# Patient Record
Sex: Female | Born: 1945
Health system: Southern US, Community
[De-identification: ages and names within clinical notes are randomized; demographics above are authoritative.]

## PROBLEM LIST (undated history)

## (undated) DIAGNOSIS — F32A Depression, unspecified: Secondary | ICD-10-CM

## (undated) DIAGNOSIS — R251 Tremor, unspecified: Secondary | ICD-10-CM

## (undated) DIAGNOSIS — K589 Irritable bowel syndrome without diarrhea: Secondary | ICD-10-CM

## (undated) DIAGNOSIS — K5792 Diverticulitis of intestine, part unspecified, without perforation or abscess without bleeding: Secondary | ICD-10-CM

## (undated) DIAGNOSIS — E559 Vitamin D deficiency, unspecified: Secondary | ICD-10-CM

## (undated) DIAGNOSIS — G43909 Migraine, unspecified, not intractable, without status migrainosus: Secondary | ICD-10-CM

## (undated) DIAGNOSIS — F329 Major depressive disorder, single episode, unspecified: Secondary | ICD-10-CM

## (undated) HISTORY — PX: OTHER SURGICAL HISTORY: SHX169

## (undated) HISTORY — DX: Tremor, unspecified: R25.1

## (undated) HISTORY — DX: Irritable bowel syndrome, unspecified: K58.9

## (undated) HISTORY — DX: Vitamin D deficiency, unspecified: E55.9

## (undated) HISTORY — DX: Major depressive disorder, single episode, unspecified: F32.9

## (undated) HISTORY — PX: ABDOMINAL HYSTERECTOMY: SHX81

## (undated) HISTORY — DX: Migraine, unspecified, not intractable, without status migrainosus: G43.909

## (undated) HISTORY — DX: Diverticulitis of intestine, part unspecified, without perforation or abscess without bleeding: K57.92

## (undated) HISTORY — DX: Depression, unspecified: F32.A

---

## 2008-03-18 ENCOUNTER — Encounter: Admission: RE | Admit: 2008-03-18 | Discharge: 2008-03-18 | Payer: Self-pay | Admitting: Family Medicine

## 2009-04-22 IMAGING — MG MM SCREEN MAMMOGRAM BILATERAL
4 series · 4 of 4 positions shown · non-contrast
Comparison: none

DG SCREEN MAMMOGRAM BILATERAL
Bilateral CC and MLO view(s) were taken.

DIGITAL SCREENING MAMMOGRAM WITH CAD:
There are scattered fibroglandular densities.  There is no dominant mass, architectural distortion 
or calcification to suggest malignancy.

[R CC]
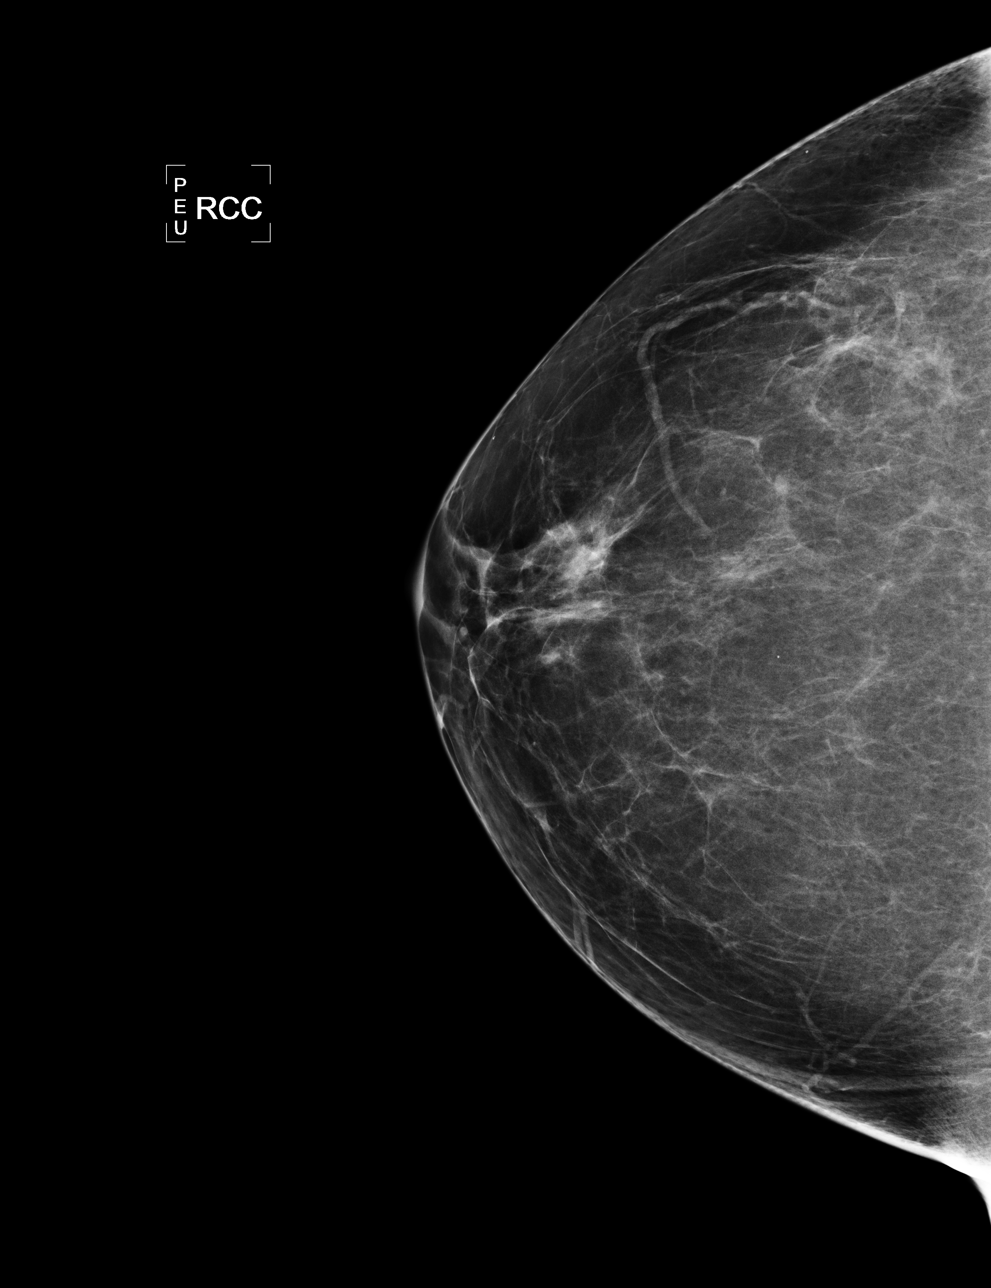

[L CC]
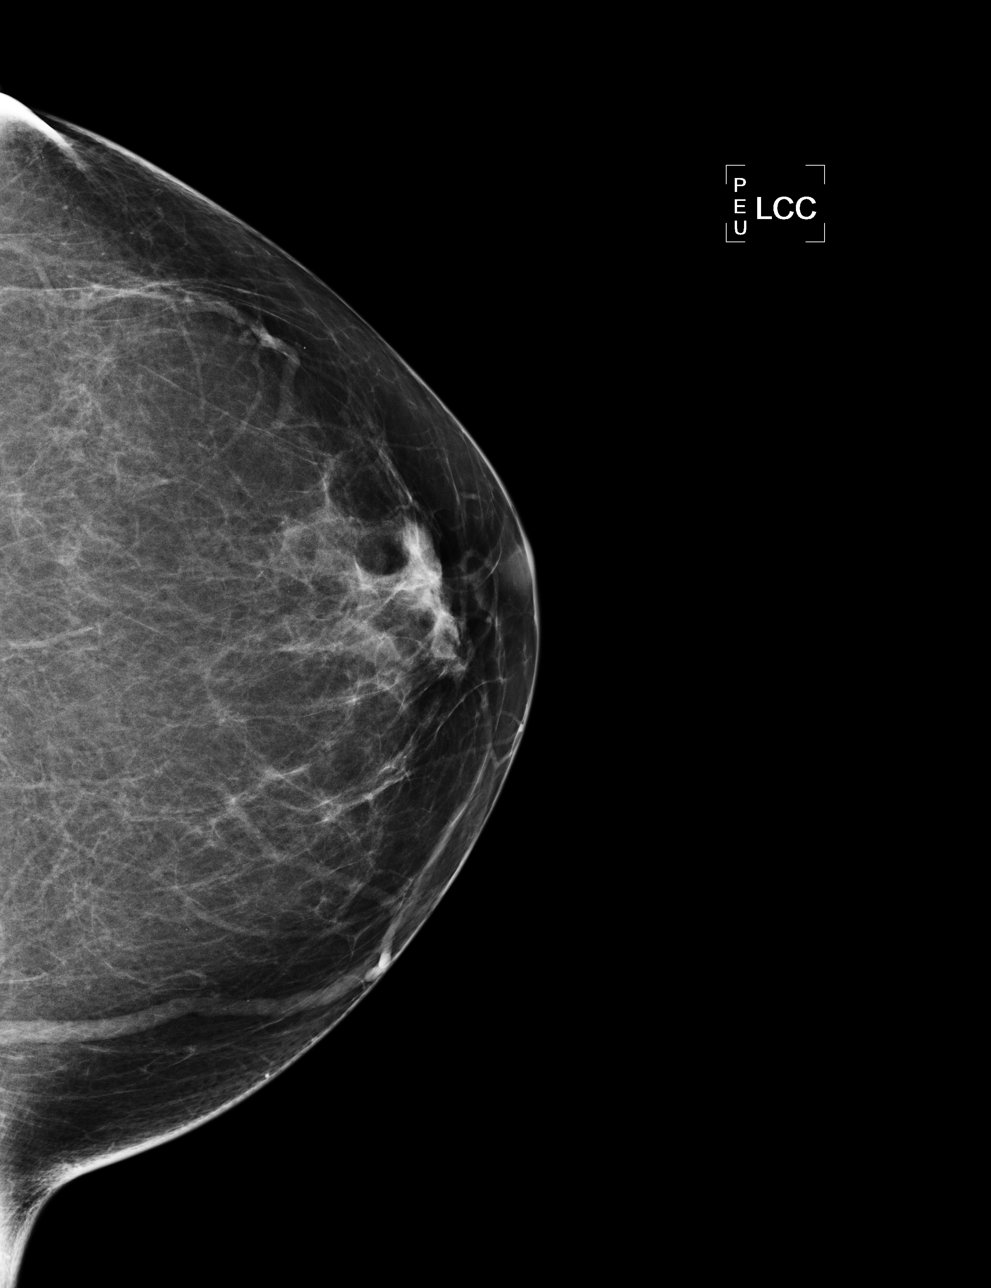

[L MLO]
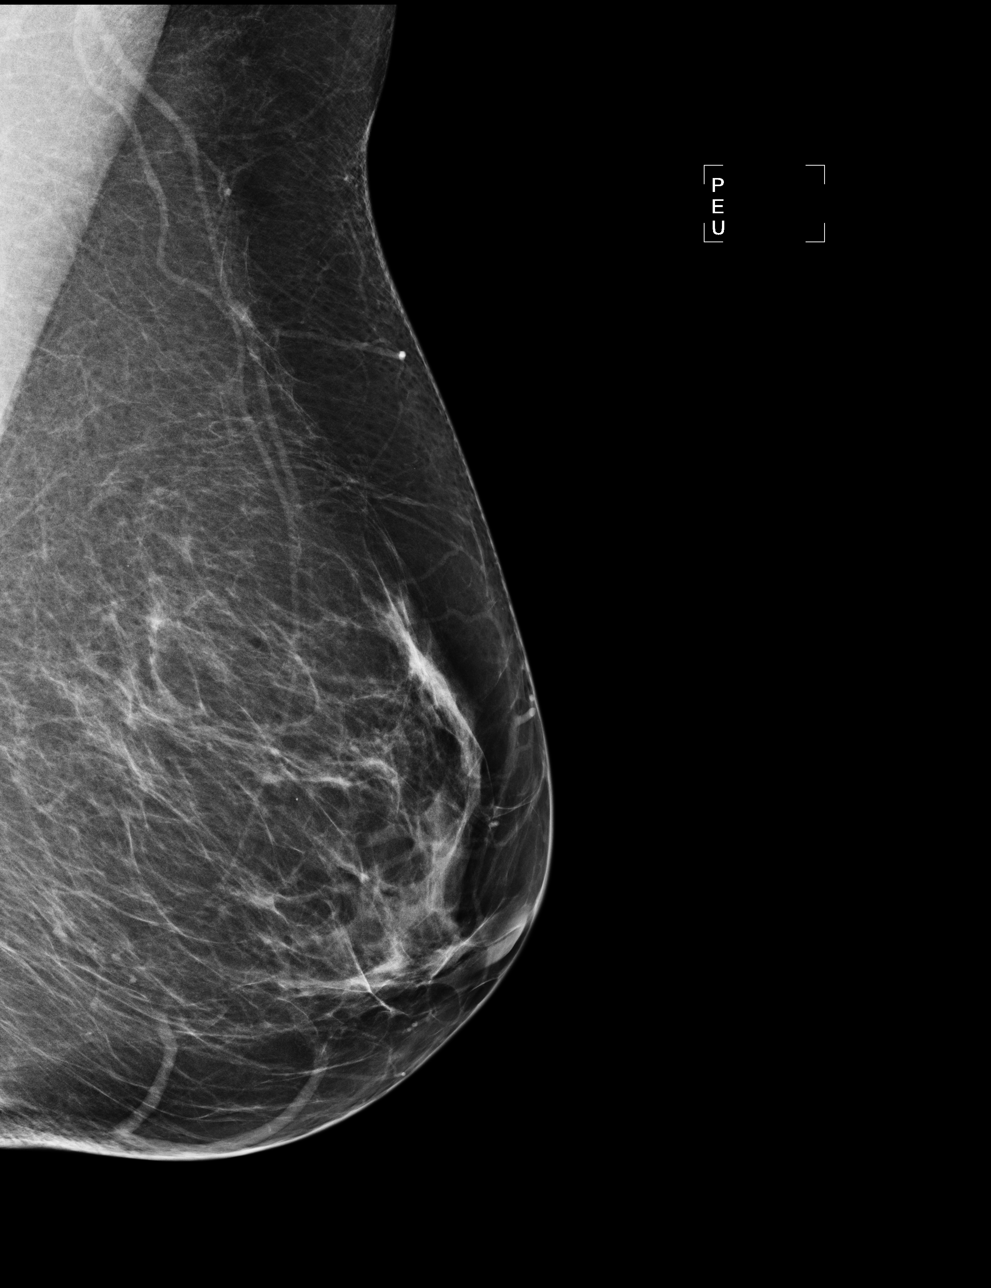

[R MLO]
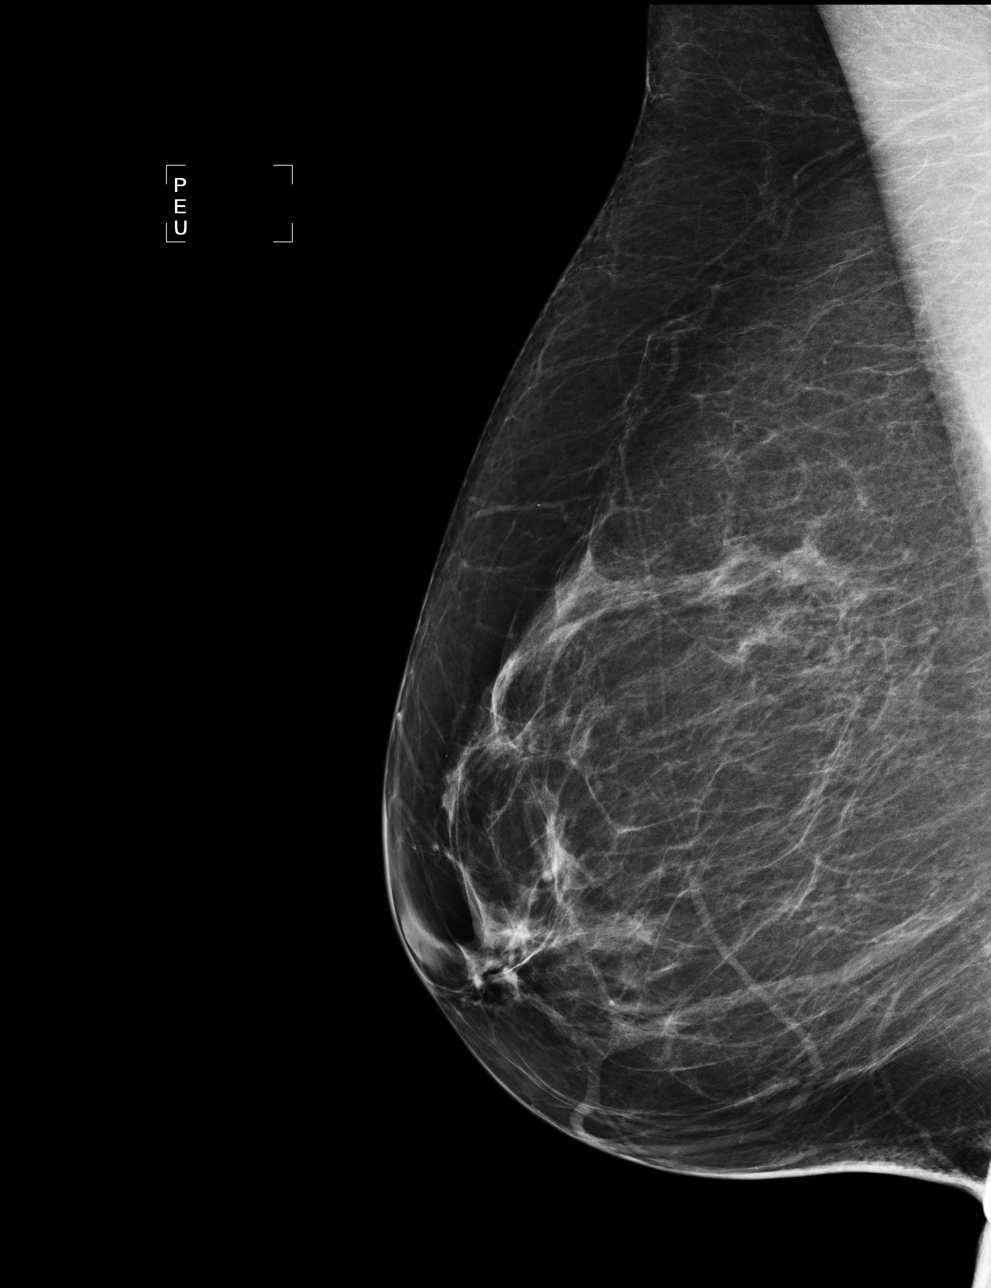

[4 of 4 positions shown; findings below may reference images not displayed]

IMPRESSION: No mammographic evidence of malignancy.  Suggest yearly screening mammography.

ASSESSMENT: Negative - BI-RADS 1

Screening mammogram in 1 year.
ANALYZED BY COMPUTER AIDED DETECTION. , THIS PROCEDURE WAS A DIGITAL MAMMOGRAM.

## 2010-01-15 ENCOUNTER — Encounter: Admission: RE | Admit: 2010-01-15 | Discharge: 2010-01-15 | Payer: Self-pay | Admitting: Family Medicine

## 2010-01-21 ENCOUNTER — Encounter: Admission: RE | Admit: 2010-01-21 | Discharge: 2010-01-21 | Payer: Self-pay | Admitting: Family Medicine

## 2010-09-02 ENCOUNTER — Encounter: Admission: RE | Admit: 2010-09-02 | Discharge: 2010-09-02 | Payer: Self-pay | Admitting: Family Medicine

## 2011-03-10 ENCOUNTER — Other Ambulatory Visit: Payer: Self-pay | Admitting: Family Medicine

## 2011-03-10 DIAGNOSIS — Z1231 Encounter for screening mammogram for malignant neoplasm of breast: Secondary | ICD-10-CM

## 2011-03-16 ENCOUNTER — Ambulatory Visit
Admission: RE | Admit: 2011-03-16 | Discharge: 2011-03-16 | Disposition: A | Discharge status: Home / Self Care | Payer: BC Managed Care – PPO | Source: Ambulatory Visit | Attending: Family Medicine | Admitting: Family Medicine

## 2011-03-16 DIAGNOSIS — Z1231 Encounter for screening mammogram for malignant neoplasm of breast: Secondary | ICD-10-CM

## 2012-02-16 ENCOUNTER — Other Ambulatory Visit: Payer: Self-pay | Admitting: Family Medicine

## 2012-02-16 DIAGNOSIS — Z1231 Encounter for screening mammogram for malignant neoplasm of breast: Secondary | ICD-10-CM

## 2012-04-18 ENCOUNTER — Ambulatory Visit
Admission: RE | Admit: 2012-04-18 | Discharge: 2012-04-18 | Disposition: A | Discharge status: Home / Self Care | Payer: BC Managed Care – PPO | Source: Ambulatory Visit | Attending: Family Medicine | Admitting: Family Medicine

## 2012-04-18 DIAGNOSIS — Z1231 Encounter for screening mammogram for malignant neoplasm of breast: Secondary | ICD-10-CM

## 2013-03-28 ENCOUNTER — Other Ambulatory Visit: Payer: Self-pay

## 2013-03-28 DIAGNOSIS — Z1231 Encounter for screening mammogram for malignant neoplasm of breast: Secondary | ICD-10-CM

## 2013-04-19 ENCOUNTER — Ambulatory Visit
Admission: RE | Admit: 2013-04-19 | Discharge: 2013-04-19 | Disposition: A | Discharge status: Home / Self Care | Payer: BC Managed Care – PPO | Source: Ambulatory Visit

## 2013-04-19 DIAGNOSIS — Z1231 Encounter for screening mammogram for malignant neoplasm of breast: Secondary | ICD-10-CM

## 2014-03-15 ENCOUNTER — Other Ambulatory Visit: Payer: Self-pay

## 2014-03-15 DIAGNOSIS — Z1231 Encounter for screening mammogram for malignant neoplasm of breast: Secondary | ICD-10-CM

## 2014-05-07 ENCOUNTER — Ambulatory Visit
Admission: RE | Admit: 2014-05-07 | Discharge: 2014-05-07 | Disposition: A | Discharge status: Home / Self Care | Payer: BC Managed Care – PPO | Source: Ambulatory Visit

## 2014-05-07 ENCOUNTER — Encounter (INDEPENDENT_AMBULATORY_CARE_PROVIDER_SITE_OTHER): Payer: Self-pay

## 2014-05-07 DIAGNOSIS — Z1231 Encounter for screening mammogram for malignant neoplasm of breast: Secondary | ICD-10-CM

## 2014-06-24 DIAGNOSIS — G43009 Migraine without aura, not intractable, without status migrainosus: Secondary | ICD-10-CM | POA: Diagnosis not present

## 2014-09-19 DIAGNOSIS — Z85828 Personal history of other malignant neoplasm of skin: Secondary | ICD-10-CM | POA: Diagnosis not present

## 2015-01-23 DIAGNOSIS — E780 Pure hypercholesterolemia: Secondary | ICD-10-CM | POA: Diagnosis not present

## 2015-01-23 DIAGNOSIS — H6120 Impacted cerumen, unspecified ear: Secondary | ICD-10-CM | POA: Diagnosis not present

## 2015-01-23 DIAGNOSIS — Z131 Encounter for screening for diabetes mellitus: Secondary | ICD-10-CM | POA: Diagnosis not present

## 2015-01-23 DIAGNOSIS — F329 Major depressive disorder, single episode, unspecified: Secondary | ICD-10-CM | POA: Diagnosis not present

## 2015-01-23 DIAGNOSIS — E559 Vitamin D deficiency, unspecified: Secondary | ICD-10-CM | POA: Diagnosis not present

## 2015-01-23 DIAGNOSIS — Z0000 Encounter for general adult medical examination without abnormal findings: Secondary | ICD-10-CM | POA: Diagnosis not present

## 2015-01-23 DIAGNOSIS — M8580 Other specified disorders of bone density and structure, unspecified site: Secondary | ICD-10-CM | POA: Diagnosis not present

## 2015-05-08 DIAGNOSIS — E559 Vitamin D deficiency, unspecified: Secondary | ICD-10-CM | POA: Diagnosis not present

## 2015-07-01 ENCOUNTER — Other Ambulatory Visit: Payer: Self-pay

## 2015-07-01 DIAGNOSIS — Z1231 Encounter for screening mammogram for malignant neoplasm of breast: Secondary | ICD-10-CM

## 2015-07-29 ENCOUNTER — Ambulatory Visit
Admission: RE | Admit: 2015-07-29 | Discharge: 2015-07-29 | Disposition: A | Discharge status: Home / Self Care | Payer: BC Managed Care – PPO | Source: Ambulatory Visit

## 2015-07-29 DIAGNOSIS — Z1231 Encounter for screening mammogram for malignant neoplasm of breast: Secondary | ICD-10-CM | POA: Diagnosis not present

## 2015-08-04 DIAGNOSIS — M859 Disorder of bone density and structure, unspecified: Secondary | ICD-10-CM | POA: Diagnosis not present

## 2015-08-04 DIAGNOSIS — M8589 Other specified disorders of bone density and structure, multiple sites: Secondary | ICD-10-CM | POA: Diagnosis not present

## 2016-08-31 ENCOUNTER — Other Ambulatory Visit: Payer: Self-pay | Admitting: Family Medicine

## 2016-08-31 DIAGNOSIS — Z1231 Encounter for screening mammogram for malignant neoplasm of breast: Secondary | ICD-10-CM

## 2016-09-14 ENCOUNTER — Ambulatory Visit
Admission: RE | Admit: 2016-09-14 | Discharge: 2016-09-14 | Disposition: A | Discharge status: Home / Self Care | Payer: Medicare Other | Source: Ambulatory Visit | Attending: Family Medicine | Admitting: Family Medicine

## 2016-09-14 DIAGNOSIS — Z1231 Encounter for screening mammogram for malignant neoplasm of breast: Secondary | ICD-10-CM

## 2016-12-21 ENCOUNTER — Encounter: Payer: Self-pay | Admitting: Neurology

## 2016-12-21 ENCOUNTER — Ambulatory Visit (INDEPENDENT_AMBULATORY_CARE_PROVIDER_SITE_OTHER): Payer: Medicare Other | Admitting: Neurology

## 2016-12-21 VITALS — BP 130/78 | HR 68 | Resp 20 | Ht 63.0 in | Wt 145.0 lb

## 2016-12-21 DIAGNOSIS — G250 Essential tremor: Secondary | ICD-10-CM

## 2016-12-21 NOTE — Patient Instructions (Addendum)
Your history and exam are in keeping with essential tremor, no evidence of parkinsonism, which is reassuring.  You can continue with low dose propranolol. We will keep an eye on your symptoms and exam, recheck in 6 months and will consider using mysoline (primidone) in the future.

## 2016-12-21 NOTE — Progress Notes (Signed)
Subjective:    Patient ID: Karen Hubbard is a 71 y.o. female.  HPI     Star Age, MD, PhD [facility] [address] [place], [place] [address]  Dear Dr. Inda Merlin,   I saw your patient, Karen Hubbard, upon your kind request in my neurologic clinic today for initial consultation of her tremors. The patient is unaccompanied today. As you know, Ms. Seery is a 71 year old right-handed woman with an underlying medical history of depression, diverticulitis, irritable bowel syndrome, vitamin D deficiency, migraine headaches, and overweight state, who has had a tremor affecting her hands, head and voice for the past years, probably years. Her symptoms are progressive. She is worried about Parkinson's disease since her father had PD. She was started on propranolol in 2017 with improvement in her hand tremor reported. She is only taking a small dose d/t Hx of low BP typically, often in the 90s/50s. Her father lived to be 51 years old and died of lung cancer, mom had a rare blood disorder, cryoglobulinemia and died at 35. Her husband was recently diagnosed with Parkinson's disease and this has added stress. She has noticed that stress and tension in the house tends to make her tremor worse. She has 1 younger brother who has no tremor. She first noticed a hand tremor about 4 years ago, particularly at work when she was typing. She retired about a year ago from [employer]. She has 2 daughters, the older one age 13 has a hand tremor. About a year ago she noticed a voice tremor and more recently perhaps in the past 6 months she has noticed a head tremor, this was pointed out to her by her husband. She tries to stay active, she exercises regularly. She goes to cardio workouts 3 times a week. She is trying to work on her balance. She has not noticed any symptoms with one-sided weakness, numbness, tingling, memory loss. She has some mild balance issues. She is a  nonsmoker, drinks alcohol a couple times a week and typically no caffeine on a daily basis. Of note, she also noted improvement in her migraines when she started the propranolol.  I reviewed your office note from 11/25/2016, which you kindly included.  Her Past Medical History Is Significant For: Past Medical History:  Diagnosis Date  . Depression   . Diverticulitis   . IBS (irritable bowel syndrome)   . Migraine   . Tremor   . Vitamin D deficiency     Her Past Surgical History Is Significant For: Past Surgical History:  Procedure Laterality Date  . ABDOMINAL HYSTERECTOMY    . colectomy      Her Family History Is Significant For: Family History  Problem Relation Age of Onset  . Lung cancer Father   . Parkinson's disease Father     Her Social History Is Significant For: Social History   Social History  . Marital status: Married    Spouse name: N/A  . Number of children: N/A  . Years of education: N/A   Social History Main Topics  . Smoking status: Never Smoker  . Smokeless tobacco: Never Used  . Alcohol use No  . Drug use: No  . Sexual activity: Not on file   Other Topics Concern  . Not on file   Social History Narrative  . No narrative on file    Her Allergies Are:  No Known Allergies:   Her Current Medications Are:  Outpatient Encounter  Prescriptions as of 12/21/2016  Medication Sig  . cholecalciferol (VITAMIN D) 1000 units tablet Take 1,000 Units by mouth daily.  Marland Kitchen escitalopram (LEXAPRO) 10 MG tablet Take 20 mg by mouth daily.   . propranolol (INDERAL) 20 MG tablet Take 20 mg by mouth 3 (three) times daily.  . SUMAtriptan (IMITREX) 100 MG tablet Take 100 mg by mouth every 2 (two) hours as needed for migraine. May repeat in 2 hours if headache persists or recurs.   No facility-administered encounter medications on file as of 12/21/2016.    Review of Systems:  Out of a complete 14 point review of systems, all are reviewed and negative with the exception  of these symptoms as listed below:  Review of Systems  Gastrointestinal: Positive for constipation and diarrhea.  Neurological:       Pt presents today to discuss tremors. Pt reports that she has noticed tremors in her hands, throat, and head. Pt is taking propranolol and feels that it is helping.    Objective:  Neurologic Exam  Physical Exam Physical Examination:   Vitals:   12/21/16 1443  BP: 130/78  Pulse: 68  Resp: 20    General Examination: The patient is a very pleasant 71 y.o. female in no acute distress. She appears well-developed and well-nourished and well groomed. Very mildly anxious appearing.  HEENT: Normocephalic, atraumatic, pupils are equal, round and reactive to light and accommodation.Mild bilateral cataracts. Funduscopic exam is normal with sharp disc margins noted. Extraocular tracking is good without limitation to gaze excursion or nystagmus noted. Normal smooth pursuit is noted. Face is symmetric with normal facial animation and normal facial sensation. Speech is clear with mild intermittent voice tremor. no dysarthria noted. There is no hypophonia. There is no lip tremor or lower jaw tremor but she has mild intermittent head side to side trembling. Neck is supple with full range of passive and active motion. There are no carotid bruits on auscultation. Oropharynx exam reveals: mild mouth dryness, good dental hygiene and mild airway crowding. Mallampati is class III. Tongue protrudes centrally and palate elevates symmetrically.  Chest: Clear to auscultation without wheezing, rhonchi or crackles noted.  Heart: S1+S2+0, regular and normal without murmurs, rubs or gallops noted.   Abdomen: Soft, non-tender and non-distended with normal bowel sounds appreciated on auscultation.  Extremities: There is no pitting edema in the distal lower extremities bilaterally. Pedal pulses are intact.  Skin: Warm and dry without trophic changes noted.  Musculoskeletal: exam  reveals no obvious joint deformities, tenderness or joint swelling or erythema.   Neurologically:  Mental status: The patient is awake, alert and oriented in all 4 spheres. Her immediate and remote memory, attention, language skills and fund of knowledge are appropriate. There is no evidence of aphasia, agnosia, apraxia or anomia. Speech is clear with normal prosody and enunciation. Thought process is linear. Mood is normal and affect is normal.  Cranial nerves II - XII are as described above under HEENT exam. In addition: shoulder shrug is normal with equal shoulder height noted. Motor exam: Normal bulk, strength and tone is noted. There is no drift, or rebound.  On 12/21/2016: There is  Very slight intermittent resting tremor in the left thumb, she has a bilateral upper extremity postural and action tremor, mild to intermittently moderate, no significant intention tremor, on Archimedes spiral drawing she has mild trembling with the rigesting moderate trembling with the left. Handwriting is legible, minimally tremulous and not micrographic.  Romberg is negative. Reflexes are 1+ throughout.  Fine motor skills and coordination: intact with normal finger taps, normal hand movements, normal rapid alternating patting, normal foot taps and normal foot agility.  Cerebellar testing: No dysmetria or intention tremor on finger to nose testing. Heel to shin is unremarkable bilaterally. There is no truncal or gait ataxia.  Sensory exam: intact to light touch in the upper and lower extremities.  Gait, station and balance: She stands easily. No veering to one side is noted. No leaning to one side is noted. Posture is age-appropriate and stance is narrow based. Gait shows normal stride length and normal pace. She has preserved arm swing, no problems turning, tandem walk is challenging for her.               Assessment and Plan:    In summary, MIKAL FERRING is a very pleasant 71 y.o.-year old female with an underlying  medical history of depression, diverticulitis, irritable bowel syndrome, vitamin D deficiency, migraine headaches, and overweight state, who presents for initial consultation of her tremors affecting both hands, head and voice. Her history and physical exam are in keeping with essential tremor. She does have a family history of Parkinson's disease but her exam is non-in keeping with parkinsonism. Nevertheless, we will continue to monitor exam and her symptoms. She had symptomatic improvement with low-dose propranolol. This was not escalated because of a history of low blood pressure typically, she can run in the 90s over 50s, today her blood pressure is elevated for her. We can certainly consider a trial of Mysoline in the future but overall her picture is in keeping with mild trembling, she is not debilitated by it. I reassured her that she does not have a history or exam suggestive of parkinsonism. We talked about potential tremor triggers and the fact that sometimes alcohol alleviates the tremor but of course this is not a treatment for tremor control. In addition, exercising can sometimes exacerbate tremors, stress, sleep deprivation and dehydration are also possible triggers for temporary exacerbation. I suggested she continue with her low-dose propranolol at this time. I would like to see her back in 6 months, sooner as needed. I answered all her questions today and she was in agreement.  Thank you very much for allowing me to participate in the care of this nice patient. If I can be of any further assistance to you please do not hesitate to call me at [phone].  Sincerely,   Star Age, MD, PhD

## 2017-06-20 ENCOUNTER — Ambulatory Visit: Payer: Medicare Other | Admitting: Neurology

## 2017-09-20 ENCOUNTER — Other Ambulatory Visit: Payer: Self-pay | Admitting: Family Medicine

## 2017-09-20 DIAGNOSIS — Z1231 Encounter for screening mammogram for malignant neoplasm of breast: Secondary | ICD-10-CM

## 2017-10-18 ENCOUNTER — Ambulatory Visit
Admission: RE | Admit: 2017-10-18 | Discharge: 2017-10-18 | Disposition: A | Discharge status: Home / Self Care | Payer: Medicare Other | Source: Ambulatory Visit | Attending: Family Medicine | Admitting: Family Medicine

## 2017-10-18 DIAGNOSIS — Z1231 Encounter for screening mammogram for malignant neoplasm of breast: Secondary | ICD-10-CM

## 2017-12-12 DIAGNOSIS — R21 Rash and other nonspecific skin eruption: Secondary | ICD-10-CM | POA: Diagnosis not present

## 2018-02-10 DIAGNOSIS — G43009 Migraine without aura, not intractable, without status migrainosus: Secondary | ICD-10-CM | POA: Diagnosis not present

## 2018-02-23 DIAGNOSIS — R69 Illness, unspecified: Secondary | ICD-10-CM | POA: Diagnosis not present

## 2018-02-23 DIAGNOSIS — M8580 Other specified disorders of bone density and structure, unspecified site: Secondary | ICD-10-CM | POA: Diagnosis not present

## 2018-02-23 DIAGNOSIS — G250 Essential tremor: Secondary | ICD-10-CM | POA: Diagnosis not present

## 2018-02-23 DIAGNOSIS — E559 Vitamin D deficiency, unspecified: Secondary | ICD-10-CM | POA: Diagnosis not present

## 2018-02-23 DIAGNOSIS — E7800 Pure hypercholesterolemia, unspecified: Secondary | ICD-10-CM | POA: Diagnosis not present

## 2018-02-23 DIAGNOSIS — H6120 Impacted cerumen, unspecified ear: Secondary | ICD-10-CM | POA: Diagnosis not present

## 2018-02-23 DIAGNOSIS — Z0000 Encounter for general adult medical examination without abnormal findings: Secondary | ICD-10-CM | POA: Diagnosis not present

## 2018-02-23 DIAGNOSIS — Z131 Encounter for screening for diabetes mellitus: Secondary | ICD-10-CM | POA: Diagnosis not present

## 2018-03-20 DIAGNOSIS — R69 Illness, unspecified: Secondary | ICD-10-CM | POA: Diagnosis not present

## 2018-05-17 DIAGNOSIS — R69 Illness, unspecified: Secondary | ICD-10-CM | POA: Diagnosis not present

## 2018-05-22 DIAGNOSIS — M8588 Other specified disorders of bone density and structure, other site: Secondary | ICD-10-CM | POA: Diagnosis not present

## 2018-05-23 DIAGNOSIS — R69 Illness, unspecified: Secondary | ICD-10-CM | POA: Diagnosis not present

## 2018-07-20 DIAGNOSIS — B356 Tinea cruris: Secondary | ICD-10-CM | POA: Diagnosis not present

## 2018-08-09 DIAGNOSIS — R69 Illness, unspecified: Secondary | ICD-10-CM | POA: Diagnosis not present

## 2018-08-17 DIAGNOSIS — N390 Urinary tract infection, site not specified: Secondary | ICD-10-CM | POA: Diagnosis not present

## 2018-08-30 DIAGNOSIS — R69 Illness, unspecified: Secondary | ICD-10-CM | POA: Diagnosis not present

## 2018-08-30 DIAGNOSIS — G250 Essential tremor: Secondary | ICD-10-CM | POA: Diagnosis not present

## 2018-09-26 DIAGNOSIS — R69 Illness, unspecified: Secondary | ICD-10-CM | POA: Diagnosis not present

## 2018-10-03 DIAGNOSIS — Z0100 Encounter for examination of eyes and vision without abnormal findings: Secondary | ICD-10-CM | POA: Diagnosis not present

## 2018-10-17 ENCOUNTER — Other Ambulatory Visit: Payer: Self-pay | Admitting: Family Medicine

## 2018-10-17 DIAGNOSIS — Z1231 Encounter for screening mammogram for malignant neoplasm of breast: Secondary | ICD-10-CM

## 2018-11-24 ENCOUNTER — Ambulatory Visit: Payer: Medicare Other

## 2019-01-17 ENCOUNTER — Ambulatory Visit
Admission: RE | Admit: 2019-01-17 | Discharge: 2019-01-17 | Disposition: A | Discharge status: Home / Self Care | Payer: Self-pay | Source: Ambulatory Visit | Attending: Family Medicine | Admitting: Family Medicine

## 2019-01-17 DIAGNOSIS — Z1231 Encounter for screening mammogram for malignant neoplasm of breast: Secondary | ICD-10-CM | POA: Diagnosis not present

## 2019-02-05 DIAGNOSIS — M79675 Pain in left toe(s): Secondary | ICD-10-CM | POA: Diagnosis not present

## 2019-02-05 DIAGNOSIS — L600 Ingrowing nail: Secondary | ICD-10-CM | POA: Diagnosis not present

## 2019-02-07 DIAGNOSIS — G43009 Migraine without aura, not intractable, without status migrainosus: Secondary | ICD-10-CM | POA: Diagnosis not present

## 2019-04-04 DIAGNOSIS — R69 Illness, unspecified: Secondary | ICD-10-CM | POA: Diagnosis not present

## 2019-04-06 DIAGNOSIS — Z79899 Other long term (current) drug therapy: Secondary | ICD-10-CM | POA: Diagnosis not present

## 2019-04-06 DIAGNOSIS — E559 Vitamin D deficiency, unspecified: Secondary | ICD-10-CM | POA: Diagnosis not present

## 2019-04-06 DIAGNOSIS — Z1159 Encounter for screening for other viral diseases: Secondary | ICD-10-CM | POA: Diagnosis not present

## 2019-04-06 DIAGNOSIS — E7800 Pure hypercholesterolemia, unspecified: Secondary | ICD-10-CM | POA: Diagnosis not present

## 2019-04-11 DIAGNOSIS — G250 Essential tremor: Secondary | ICD-10-CM | POA: Diagnosis not present

## 2019-04-11 DIAGNOSIS — G43009 Migraine without aura, not intractable, without status migrainosus: Secondary | ICD-10-CM | POA: Diagnosis not present

## 2019-04-11 DIAGNOSIS — E7800 Pure hypercholesterolemia, unspecified: Secondary | ICD-10-CM | POA: Diagnosis not present

## 2019-04-11 DIAGNOSIS — R69 Illness, unspecified: Secondary | ICD-10-CM | POA: Diagnosis not present

## 2019-04-11 DIAGNOSIS — M8580 Other specified disorders of bone density and structure, unspecified site: Secondary | ICD-10-CM | POA: Diagnosis not present

## 2019-04-11 DIAGNOSIS — Z0000 Encounter for general adult medical examination without abnormal findings: Secondary | ICD-10-CM | POA: Diagnosis not present

## 2019-04-11 DIAGNOSIS — E559 Vitamin D deficiency, unspecified: Secondary | ICD-10-CM | POA: Diagnosis not present

## 2019-05-10 IMAGING — MG DIGITAL SCREENING BILATERAL MAMMOGRAM WITH CAD
4 series · 4 of 4 positions shown · non-contrast
Comparison: Previous exam(s).

CLINICAL DATA: Screening.

EXAM:
DIGITAL SCREENING BILATERAL MAMMOGRAM WITH CAD

[R CC]
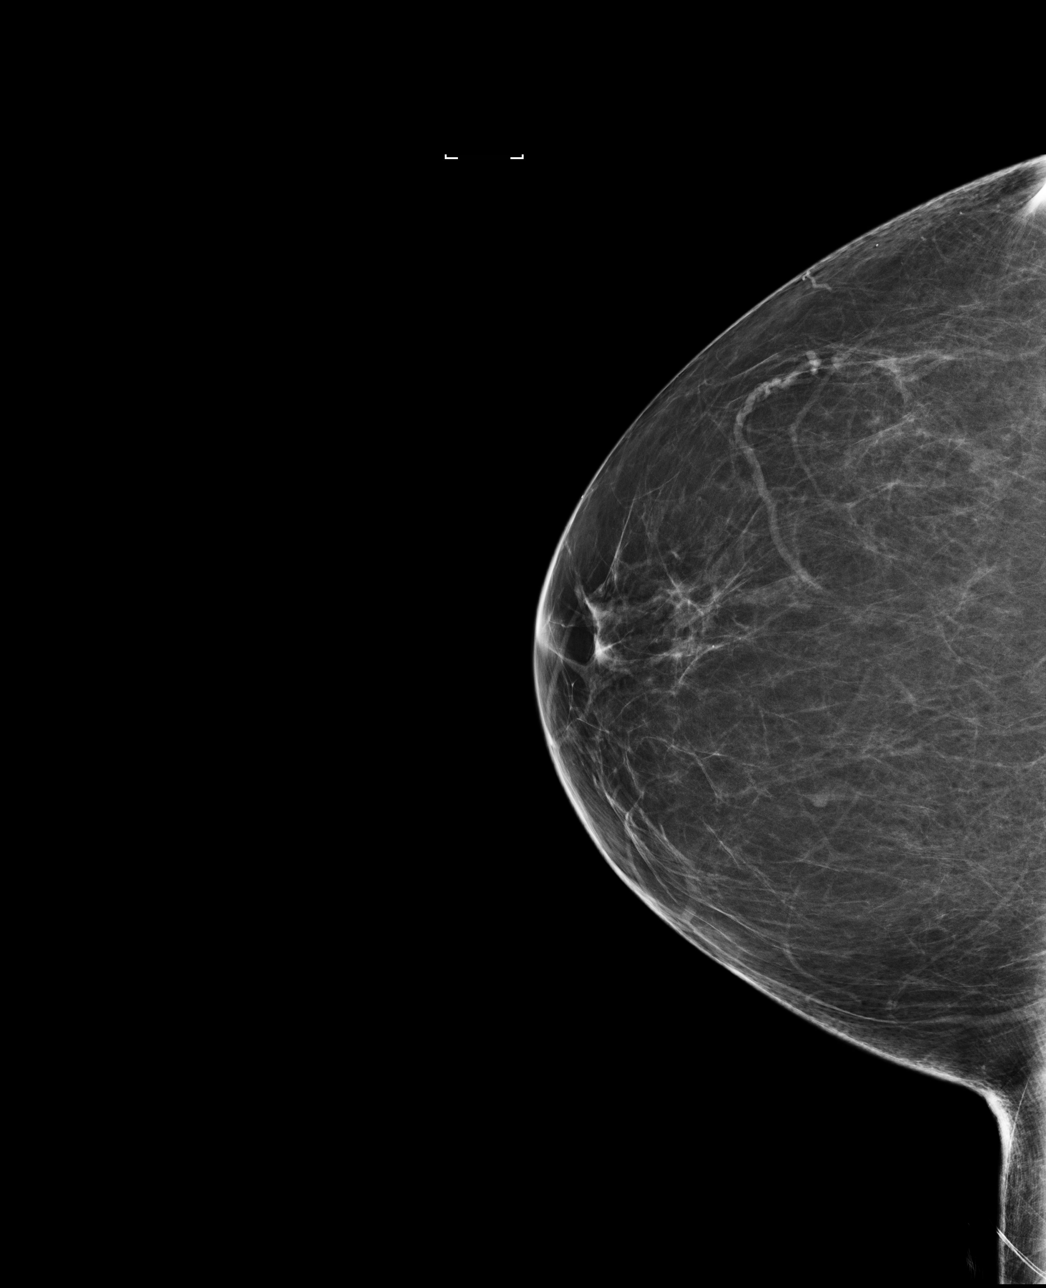

[L CC]
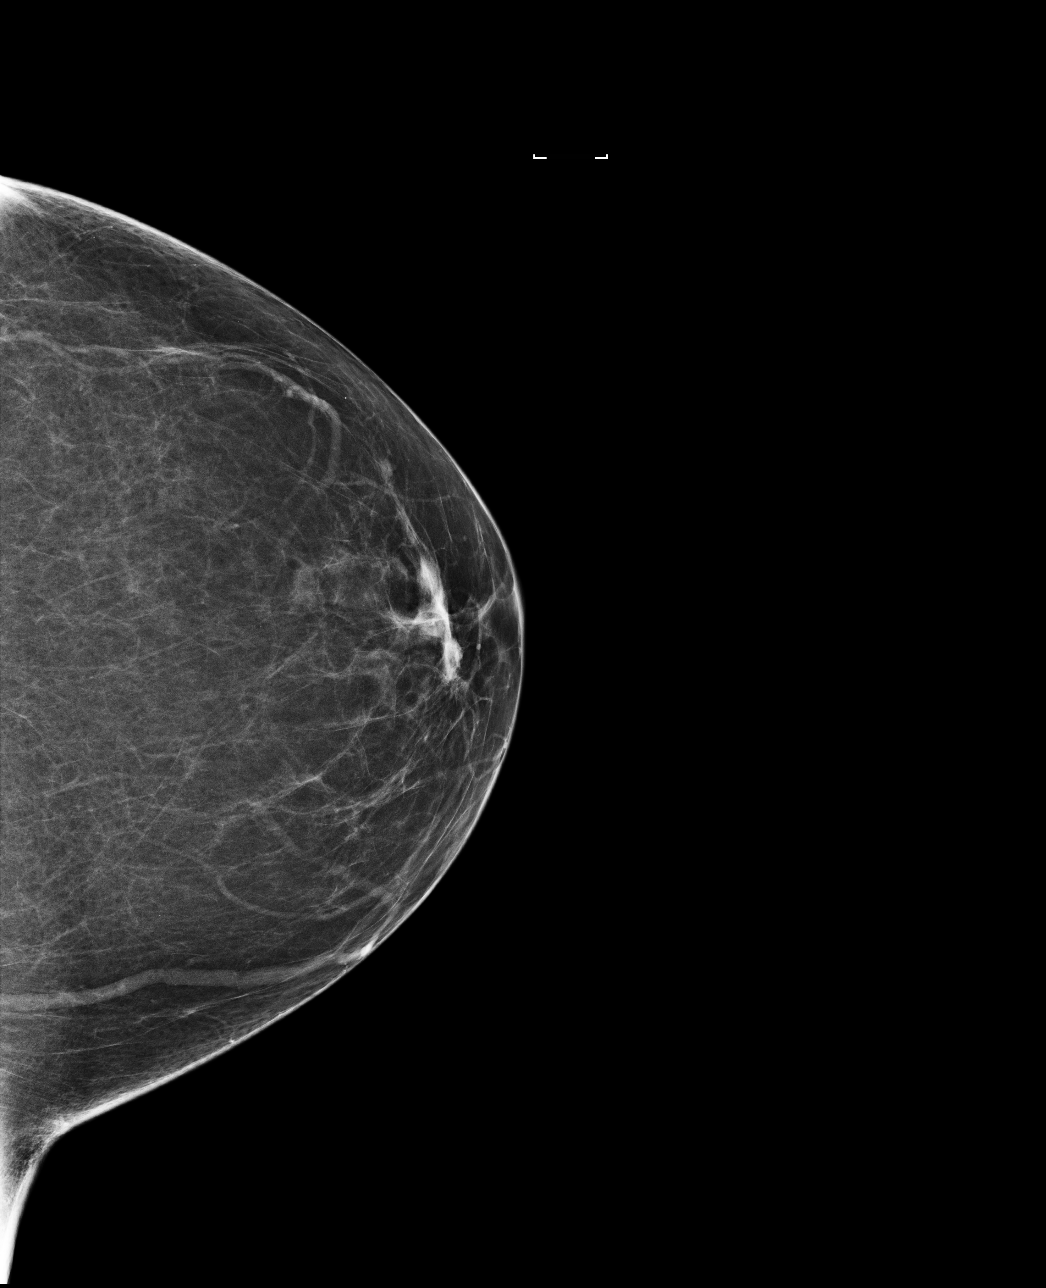

[R MLO]
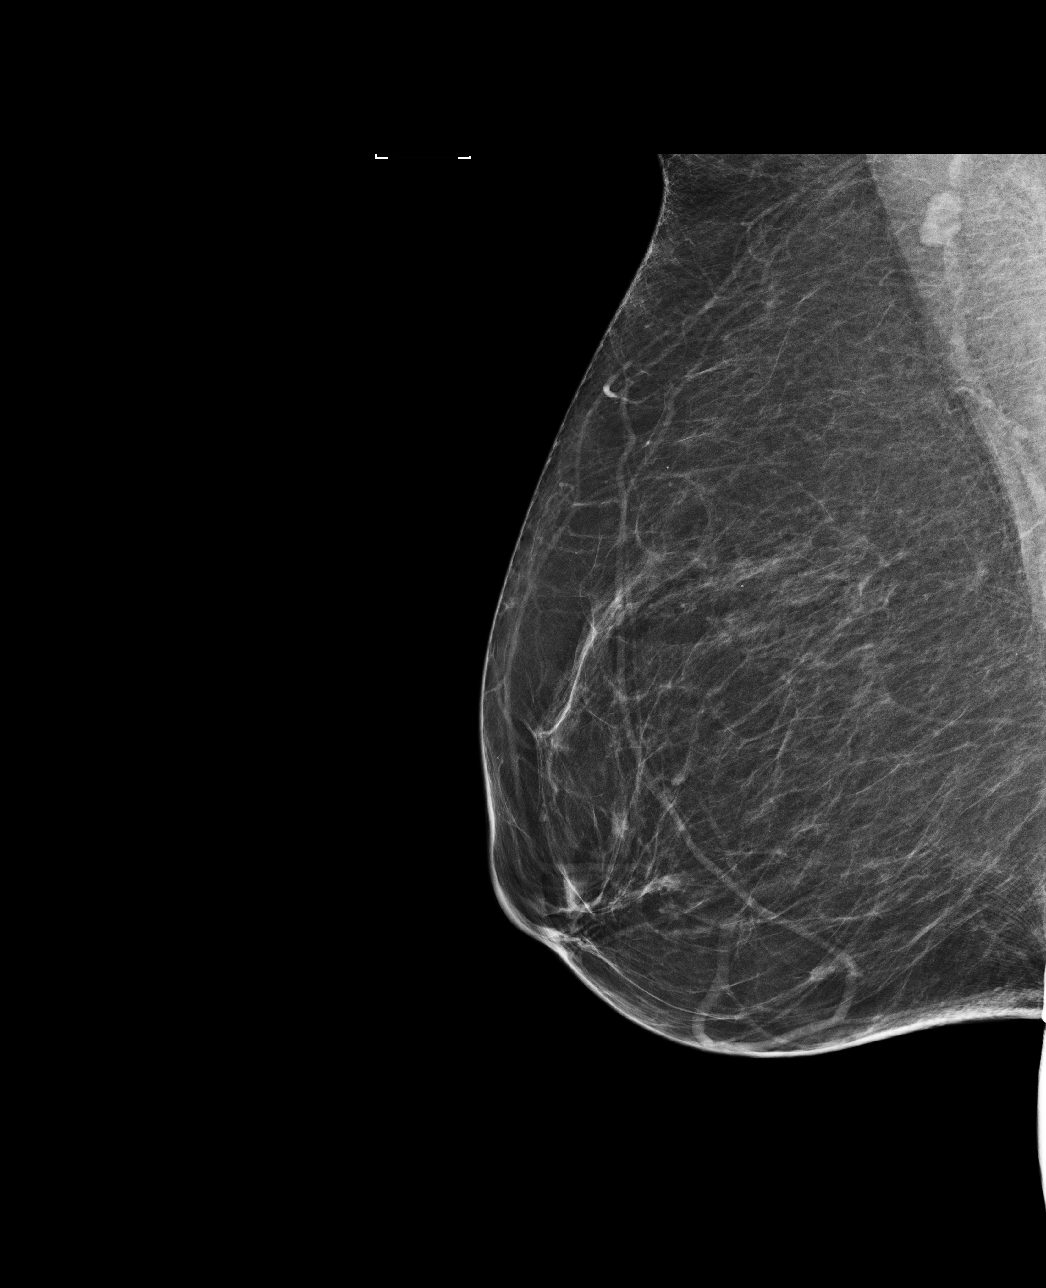

[L MLO]
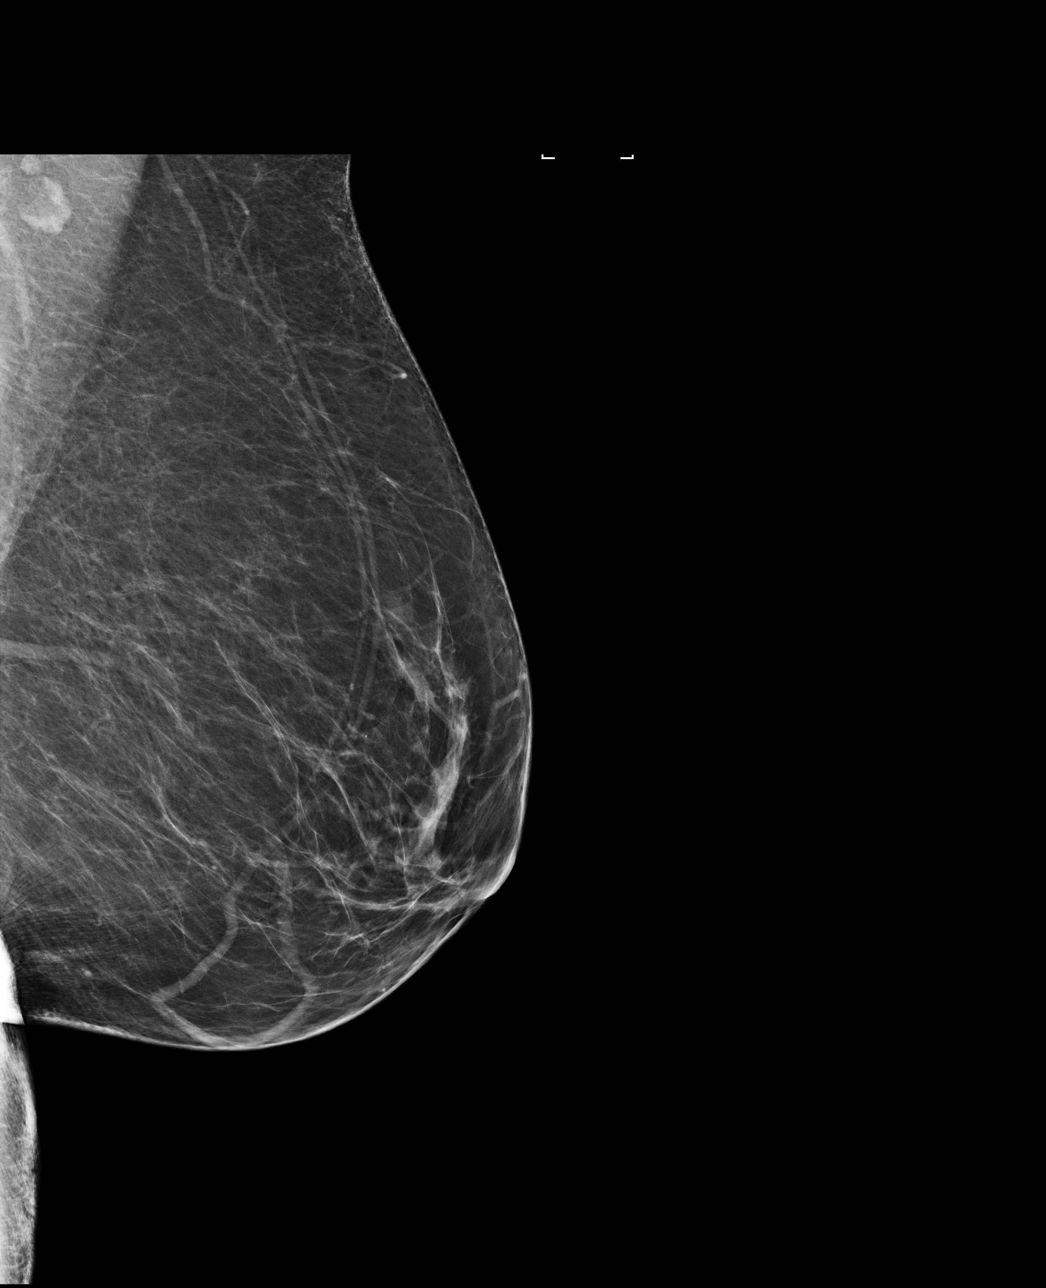

[4 of 4 positions shown; findings below may reference images not displayed]

ACR Breast Density Category b: There are scattered areas of
fibroglandular density.
FINDINGS: There are no findings suspicious for malignancy. Images were
processed with CAD.
IMPRESSION: No mammographic evidence of malignancy. A result letter of this
screening mammogram will be mailed directly to the patient.

RECOMMENDATION:
Screening mammogram in one year. (Code:AS-G-LCT)

BI-RADS CATEGORY  1: Negative.

## 2019-06-20 DIAGNOSIS — M7752 Other enthesopathy of left foot: Secondary | ICD-10-CM | POA: Diagnosis not present

## 2019-06-20 DIAGNOSIS — L600 Ingrowing nail: Secondary | ICD-10-CM | POA: Diagnosis not present

## 2019-10-15 DIAGNOSIS — E7800 Pure hypercholesterolemia, unspecified: Secondary | ICD-10-CM | POA: Diagnosis not present

## 2019-10-15 DIAGNOSIS — G43009 Migraine without aura, not intractable, without status migrainosus: Secondary | ICD-10-CM | POA: Diagnosis not present

## 2019-10-15 DIAGNOSIS — M8580 Other specified disorders of bone density and structure, unspecified site: Secondary | ICD-10-CM | POA: Diagnosis not present

## 2019-10-15 DIAGNOSIS — R69 Illness, unspecified: Secondary | ICD-10-CM | POA: Diagnosis not present

## 2019-10-15 DIAGNOSIS — G250 Essential tremor: Secondary | ICD-10-CM | POA: Diagnosis not present

## 2019-10-19 DIAGNOSIS — R69 Illness, unspecified: Secondary | ICD-10-CM | POA: Diagnosis not present

## 2020-01-05 ENCOUNTER — Ambulatory Visit: Payer: Medicare HMO | Attending: Internal Medicine

## 2020-01-05 DIAGNOSIS — Z23 Encounter for immunization: Secondary | ICD-10-CM | POA: Insufficient documentation

## 2020-01-05 NOTE — Progress Notes (Signed)
   Covid-19 Vaccination Clinic  Name:  Karen Hubbard    MRN: [id number] DOB: 1945-12-28  01/05/2020  [name] was observed post Covid-19 immunization for 15 minutes without incidence. She was provided with Vaccine Information Sheet and instruction to access the V-Safe system.   [name] was instructed to call 911 with any severe reactions post vaccine: Marland Kitchen Difficulty breathing  . Swelling of your face and throat  . A fast heartbeat  . A bad rash all over your body  . Dizziness and weakness    Immunizations Administered    Name Date Dose VIS Date Route   Pfizer COVID-19 Vaccine 01/05/2020  3:02 PM 0.3 mL 10/26/2019 Intramuscular   Manufacturer: [place]   Lot: [id number]   [place]: [id number]

## 2020-01-07 ENCOUNTER — Other Ambulatory Visit: Payer: Self-pay | Admitting: Family Medicine

## 2020-01-07 DIAGNOSIS — Z1231 Encounter for screening mammogram for malignant neoplasm of breast: Secondary | ICD-10-CM

## 2020-01-28 ENCOUNTER — Ambulatory Visit: Payer: Medicare HMO | Attending: Internal Medicine

## 2020-01-28 DIAGNOSIS — Z23 Encounter for immunization: Secondary | ICD-10-CM

## 2020-01-28 NOTE — Progress Notes (Signed)
   Covid-19 Vaccination Clinic  Name:  LULAR ELIE    MRN: [id number] DOB: [birth year]  01/28/2020  Ms. Browder was observed post Covid-19 immunization for 15 minutes without incident. She was provided with Vaccine Information Sheet and instruction to access the V-Safe system.   Ms. Marsili was instructed to call 911 with any severe reactions post vaccine: Marland Kitchen Difficulty breathing  . Swelling of face and throat  . A fast heartbeat  . A bad rash all over body  . Dizziness and weakness   Immunizations Administered    Name Date Dose VIS Date Route   Pfizer COVID-19 Vaccine 01/28/2020 10:36 AM 0.3 mL 10/26/2019 Intramuscular   Manufacturer: [place]   Lot: [id number]   [place]: [id number]

## 2020-02-07 DIAGNOSIS — G43009 Migraine without aura, not intractable, without status migrainosus: Secondary | ICD-10-CM | POA: Diagnosis not present

## 2020-02-12 ENCOUNTER — Ambulatory Visit: Payer: Medicare HMO

## 2020-03-18 ENCOUNTER — Other Ambulatory Visit: Payer: Self-pay

## 2020-03-18 ENCOUNTER — Ambulatory Visit
Admission: RE | Admit: 2020-03-18 | Discharge: 2020-03-18 | Disposition: A | Discharge status: Home / Self Care | Payer: Medicare HMO | Source: Ambulatory Visit | Attending: Family Medicine | Admitting: Family Medicine

## 2020-03-18 DIAGNOSIS — Z1231 Encounter for screening mammogram for malignant neoplasm of breast: Secondary | ICD-10-CM | POA: Diagnosis not present

## 2020-05-09 DIAGNOSIS — Z1211 Encounter for screening for malignant neoplasm of colon: Secondary | ICD-10-CM | POA: Diagnosis not present

## 2020-05-09 DIAGNOSIS — R69 Illness, unspecified: Secondary | ICD-10-CM | POA: Diagnosis not present

## 2020-05-09 DIAGNOSIS — E7800 Pure hypercholesterolemia, unspecified: Secondary | ICD-10-CM | POA: Diagnosis not present

## 2020-05-09 DIAGNOSIS — E559 Vitamin D deficiency, unspecified: Secondary | ICD-10-CM | POA: Diagnosis not present

## 2020-05-09 DIAGNOSIS — Z0000 Encounter for general adult medical examination without abnormal findings: Secondary | ICD-10-CM | POA: Diagnosis not present

## 2020-05-09 DIAGNOSIS — G43009 Migraine without aura, not intractable, without status migrainosus: Secondary | ICD-10-CM | POA: Diagnosis not present

## 2020-05-09 DIAGNOSIS — M8580 Other specified disorders of bone density and structure, unspecified site: Secondary | ICD-10-CM | POA: Diagnosis not present

## 2020-05-12 ENCOUNTER — Other Ambulatory Visit: Payer: Self-pay | Admitting: Family Medicine

## 2020-05-12 DIAGNOSIS — M8580 Other specified disorders of bone density and structure, unspecified site: Secondary | ICD-10-CM

## 2020-05-22 DIAGNOSIS — R69 Illness, unspecified: Secondary | ICD-10-CM | POA: Diagnosis not present

## 2020-05-30 DIAGNOSIS — Z1211 Encounter for screening for malignant neoplasm of colon: Secondary | ICD-10-CM | POA: Diagnosis not present

## 2020-10-15 ENCOUNTER — Other Ambulatory Visit: Payer: Self-pay

## 2020-10-15 ENCOUNTER — Ambulatory Visit
Admission: RE | Admit: 2020-10-15 | Discharge: 2020-10-15 | Disposition: A | Discharge status: Home / Self Care | Payer: Medicare HMO | Source: Ambulatory Visit | Attending: Family Medicine | Admitting: Family Medicine

## 2020-10-15 DIAGNOSIS — M8580 Other specified disorders of bone density and structure, unspecified site: Secondary | ICD-10-CM

## 2020-10-15 DIAGNOSIS — M85832 Other specified disorders of bone density and structure, left forearm: Secondary | ICD-10-CM | POA: Diagnosis not present

## 2020-10-15 DIAGNOSIS — Z780 Asymptomatic menopausal state: Secondary | ICD-10-CM | POA: Diagnosis not present

## 2020-10-15 DIAGNOSIS — M85851 Other specified disorders of bone density and structure, right thigh: Secondary | ICD-10-CM | POA: Diagnosis not present

## 2020-10-24 DIAGNOSIS — Z0100 Encounter for examination of eyes and vision without abnormal findings: Secondary | ICD-10-CM | POA: Diagnosis not present

## 2020-10-28 DIAGNOSIS — Z0100 Encounter for examination of eyes and vision without abnormal findings: Secondary | ICD-10-CM | POA: Diagnosis not present

## 2021-02-03 ENCOUNTER — Other Ambulatory Visit: Payer: Self-pay | Admitting: Family Medicine

## 2021-02-03 DIAGNOSIS — Z1231 Encounter for screening mammogram for malignant neoplasm of breast: Secondary | ICD-10-CM

## 2021-03-27 ENCOUNTER — Ambulatory Visit: Payer: Medicare HMO

## 2021-05-15 DIAGNOSIS — G43009 Migraine without aura, not intractable, without status migrainosus: Secondary | ICD-10-CM | POA: Diagnosis not present

## 2021-05-15 DIAGNOSIS — M8580 Other specified disorders of bone density and structure, unspecified site: Secondary | ICD-10-CM | POA: Diagnosis not present

## 2021-05-15 DIAGNOSIS — Z0000 Encounter for general adult medical examination without abnormal findings: Secondary | ICD-10-CM | POA: Diagnosis not present

## 2021-05-15 DIAGNOSIS — E7800 Pure hypercholesterolemia, unspecified: Secondary | ICD-10-CM | POA: Diagnosis not present

## 2021-05-15 DIAGNOSIS — G250 Essential tremor: Secondary | ICD-10-CM | POA: Diagnosis not present

## 2021-05-15 DIAGNOSIS — R69 Illness, unspecified: Secondary | ICD-10-CM | POA: Diagnosis not present

## 2021-05-15 DIAGNOSIS — Z1211 Encounter for screening for malignant neoplasm of colon: Secondary | ICD-10-CM | POA: Diagnosis not present

## 2021-05-15 DIAGNOSIS — Z23 Encounter for immunization: Secondary | ICD-10-CM | POA: Diagnosis not present

## 2021-05-15 DIAGNOSIS — E559 Vitamin D deficiency, unspecified: Secondary | ICD-10-CM | POA: Diagnosis not present

## 2021-05-22 ENCOUNTER — Ambulatory Visit
Admission: RE | Admit: 2021-05-22 | Discharge: 2021-05-22 | Disposition: A | Discharge status: Home / Self Care | Payer: Medicare HMO | Source: Ambulatory Visit | Attending: Family Medicine | Admitting: Family Medicine

## 2021-05-22 ENCOUNTER — Other Ambulatory Visit: Payer: Self-pay

## 2021-05-22 DIAGNOSIS — Z1231 Encounter for screening mammogram for malignant neoplasm of breast: Secondary | ICD-10-CM

## 2021-06-02 DIAGNOSIS — G43009 Migraine without aura, not intractable, without status migrainosus: Secondary | ICD-10-CM | POA: Diagnosis not present

## 2021-06-27 DIAGNOSIS — M25561 Pain in right knee: Secondary | ICD-10-CM | POA: Diagnosis not present

## 2021-08-19 ENCOUNTER — Ambulatory Visit: Payer: Medicare HMO | Admitting: Orthopedic Surgery

## 2021-08-26 ENCOUNTER — Ambulatory Visit: Payer: Medicare HMO

## 2021-08-26 ENCOUNTER — Encounter: Payer: Self-pay | Admitting: Orthopedic Surgery

## 2021-08-26 ENCOUNTER — Ambulatory Visit (INDEPENDENT_AMBULATORY_CARE_PROVIDER_SITE_OTHER): Payer: Medicare HMO | Admitting: Orthopedic Surgery

## 2021-08-26 ENCOUNTER — Other Ambulatory Visit: Payer: Self-pay

## 2021-08-26 VITALS — BP 139/61 | HR 64 | Ht 63.0 in | Wt 141.0 lb

## 2021-08-26 DIAGNOSIS — M25561 Pain in right knee: Secondary | ICD-10-CM | POA: Diagnosis not present

## 2021-08-26 NOTE — Patient Instructions (Signed)
Instructions Following Joint Injections  In clinic today, you received an injection in one of your joints (sometimes more than one).  Occasionally, you can have some pain at the injection site, this is normal.  You can place ice at the injection site, or take over-the-counter medications such as Tylenol (acetaminophen) or Advil (ibuprofen).  Please follow all directions listed on the bottle.  If your joint (knee or shoulder) becomes swollen, red or very painful, please contact the clinic for additional assistance.   Two medications were injected, including lidocaine and a steroid (often referred to as cortisone).  Lidocaine is effective almost immediately but wears off quickly.  However, the steroid can take a few days to improve your symptoms.  In some cases, it can make your pain worse for a couple of days.  Do not be concerned if this happens as it is common.  You can apply ice or take some over-the-counter medications as needed.      Knee Exercises  Ask your health care provider which exercises are safe for you. Do exercises exactly as told by your health care provider and adjust them as directed. It is normal to feel mild stretching, pulling, tightness, or discomfort as you do these exercises. Stop right away if you feel sudden pain or your pain gets worse. Do not begin these exercises until told by your health care provider.  Stretching and range-of-motion exercises These exercises warm up your muscles and joints and improve the movement and flexibility of your knee. These exercises also help to relieve pain and swelling.  Knee extension, prone Lie on your abdomen (prone position) on a bed. Place your left / right knee just beyond the edge of the surface so your knee is not on the bed. You can put a towel under your left / right thigh just above your kneecap for comfort. Relax your leg muscles and allow gravity to straighten your knee (extension). You should feel a stretch behind your left  / right knee. Hold this position for 10 seconds. Scoot up so your knee is supported between repetitions. Repeat 10 times. Complete this exercise 3-4 times per week.     Knee flexion, active Lie on your back with both legs straight. If this causes back discomfort, bend your left / right knee so your foot is flat on the floor. Slowly slide your left / right heel back toward your buttocks. Stop when you feel a gentle stretch in the front of your knee or thigh (flexion). Hold this position for 10 seconds. Slowly slide your left / right heel back to the starting position. Repeat 10 times. Complete this exercise 3-4 times per week.      Quadriceps stretch, prone Lie on your abdomen on a firm surface, such as a bed or padded floor. Bend your left / right knee and hold your ankle. If you cannot reach your ankle or pant leg, loop a belt around your foot and grab the belt instead. Gently pull your heel toward your buttocks. Your knee should not slide out to the side. You should feel a stretch in the front of your thigh and knee (quadriceps). Hold this position for 10 seconds. Repeat 10 times. Complete this exercise 3-4 times per week.      Hamstring, supine Lie on your back (supine position). Loop a belt or towel over the ball of your left / right foot. The ball of your foot is on the walking surface, right under your toes. Straighten your left /   right knee and slowly pull on the belt to raise your leg until you feel a gentle stretch behind your knee (hamstring). Do not let your knee bend while you do this. Keep your other leg flat on the floor. Hold this position for 10 seconds. Repeat 10 times. Complete this exercise 3-4 times per week.   Strengthening exercises These exercises build strength and endurance in your knee. Endurance is the ability to use your muscles for a long time, even after they get tired.  Quadriceps, isometric This exercise stretches the muscles in front of your thigh  (quadriceps) without moving your knee joint (isometric). Lie on your back with your left / right leg extended and your other knee bent. Put a rolled towel or small pillow under your knee if told by your health care provider. Slowly tense the muscles in the front of your left / right thigh. You should see your kneecap slide up toward your hip or see increased dimpling just above the knee. This motion will push the back of the knee toward the floor. For 10 seconds, hold the muscle as tight as you can without increasing your pain. Relax the muscles slowly and completely. Repeat 10 times. Complete this exercise 3-4 times per week. .     Straight leg raises This exercise stretches the muscles in front of your thigh (quadriceps) and the muscles that move your hips (hip flexors). Lie on your back with your left / right leg extended and your other knee bent. Tense the muscles in the front of your left / right thigh. You should see your kneecap slide up or see increased dimpling just above the knee. Your thigh may even shake a bit. Keep these muscles tight as you raise your leg 4-6 inches (10-15 cm) off the floor. Do not let your knee bend. Hold this position for 10 seconds. Keep these muscles tense as you lower your leg. Relax your muscles slowly and completely after each repetition. Repeat 10 times. Complete this exercise 3-4 times per week.  Hamstring, isometric Lie on your back on a firm surface. Bend your left / right knee about 30 degrees. Dig your left / right heel into the surface as if you are trying to pull it toward your buttocks. Tighten the muscles in the back of your thighs (hamstring) to "dig" as hard as you can without increasing any pain. Hold this position for 10 seconds. Release the tension gradually and allow your muscles to relax completely for __________ seconds after each repetition. Repeat 10 times. Complete this exercise 3-4 times per week.  Hamstring curls If told by your  health care provider, do this exercise while wearing ankle weights. Begin with 5 lb weights. Then increase the weight by 1 lb (0.5 kg) increments. You can also use an exercise band Lie on your abdomen with your legs straight. Bend your left / right knee as far as you can without feeling pain. Keep your hips flat against the floor. Hold this position for 10 seconds. Slowly lower your leg to the starting position. Repeat 10 times. Complete this exercise 3-4 times per week.      Squats This exercise strengthens the muscles in front of your thigh and knee (quadriceps). Stand in front of a table, with your feet and knees pointing straight ahead. You may rest your hands on the table for balance but not for support. Slowly bend your knees and lower your hips like you are going to sit in a chair. Keep   your weight over your heels, not over your toes. Keep your lower legs upright so they are parallel with the table legs. Do not let your hips go lower than your knees. Do not bend lower than told by your health care provider. If your knee pain increases, do not bend as low. Hold the squat position for 10 seconds. Slowly push with your legs to return to standing. Do not use your hands to pull yourself to standing. Repeat 10 times. Complete this exercise 3-4 times per week .     Wall slides This exercise strengthens the muscles in front of your thigh and knee (quadriceps). Lean your back against a smooth wall or door, and walk your feet out 18-24 inches (46-61 cm) from it. Place your feet hip-width apart. Slowly slide down the wall or door until your knees bend 90 degrees. Keep your knees over your heels, not over your toes. Keep your knees in line with your hips. Hold this position for 10 seconds. Repeat 10 times. Complete this exercise 3-4 times per week.      Straight leg raises This exercise strengthens the muscles that rotate the leg at the hip and move it away from your body (hip  abductors). Lie on your side with your left / right leg in the top position. Lie so your head, shoulder, knee, and hip line up. You may bend your bottom knee to help you keep your balance. Roll your hips slightly forward so your hips are stacked directly over each other and your left / right knee is facing forward. Leading with your heel, lift your top leg 4-6 inches (10-15 cm). You should feel the muscles in your outer hip lifting. Do not let your foot drift forward. Do not let your knee roll toward the ceiling. Hold this position for 10 seconds. Slowly return your leg to the starting position. Let your muscles relax completely after each repetition. Repeat 10 times. Complete this exercise 3-4 times per week.      Straight leg raises This exercise stretches the muscles that move your hips away from the front of the pelvis (hip extensors). Lie on your abdomen on a firm surface. You can put a pillow under your hips if that is more comfortable. Tense the muscles in your buttocks and lift your left / right leg about 4-6 inches (10-15 cm). Keep your knee straight as you lift your leg. Hold this position for 10 seconds. Slowly lower your leg to the starting position. Let your leg relax completely after each repetition. Repeat 10 times. Complete this exercise 3-4 times per week.  

## 2021-08-26 NOTE — Progress Notes (Signed)
New Patient Visit  Assessment: LATAJA NEWLAND is a 75 y.o. female with the following: 1. Acute pain of right knee; possible medial meniscus tear  Plan: Patient has had pain in the right knee for the past 2 months.  She describes a twisting event, which started her pain.  She has had some swelling in her knee.  She denies mechanical symptoms.  On physical exam, she has some pain with McMurray's testing, and some tenderness along the medial joint line.  Radiographs demonstrate minimal degenerative changes.  We discussed multiple treatment options, and she is elected to proceed with a steroid injection.  I have also provided her with a home exercise program, she will be moving to [place] for the next 6-8 weeks.  Pending the results of the injection, we may have to consider obtaining an MRI.  Procedure note injection Right knee joint   Verbal consent was obtained to inject the right knee joint  Timeout was completed to confirm the site of injection.  The skin was prepped with alcohol and ethyl chloride was sprayed at the injection site.  A 21-gauge needle was used to inject 40 mg of Depo-Medrol and 1% lidocaine (3 cc) into the right knee using an anterolateral approach.  There were no complications. A sterile bandage was applied.    Follow-up: Return if symptoms worsen or fail to improve.  Subjective:  Chief Complaint  Patient presents with   Knee Pain    Rt knee pain for 2 months pt states she twisted wrong and she's had pain since. No popping or snapping heard.     History of Present Illness: SHAWNICE TILMON is a 75 y.o. female who presents to clinic today for evaluation of right knee pain.  Approximately 2 months ago, she twisted her right knee.  Ever since, she has had pain.  It is worse in the medial aspect of her knee.  She has been taking some over-the-counter medications, with limited improvement in her symptoms.  She denies immediate swelling in her right knee, but has noticed  some swelling since the initial injury.  She denies locking, catching or any buckling sensations.  She does notice issues in her knee when she tries to golf.  She does not want to physical therapy.  No previous injections.  She has never hurt this knee before.   Review of Systems: No fevers or chills No numbness or tingling No chest pain No shortness of breath No bowel or bladder dysfunction No GI distress No headaches   Medical History:  Past Medical History:  Diagnosis Date   Depression    Diverticulitis    IBS (irritable bowel syndrome)    Migraine    Tremor    Vitamin D deficiency     Past Surgical History:  Procedure Laterality Date   ABDOMINAL HYSTERECTOMY     colectomy      Family History  Problem Relation Age of Onset   Lung cancer Father    Parkinson's disease Father    Social History   Tobacco Use   Smoking status: Never   Smokeless tobacco: Never  Substance Use Topics   Alcohol use: No   Drug use: No    Allergies  Allergen Reactions   Chocolate Other (See Comments)    Migraines    Current Meds  Medication Sig   cholecalciferol (VITAMIN D) 1000 units tablet Take 1,000 Units by mouth daily.   escitalopram (LEXAPRO) 20 MG tablet Take 20 mg by mouth daily.  propranolol (INDERAL) 20 MG tablet Take 20 mg by mouth 3 (three) times daily.   SUMAtriptan (IMITREX) 100 MG tablet Take 100 mg by mouth every 2 (two) hours as needed for migraine. May repeat in 2 hours if headache persists or recurs.    Objective: BP 139/61   Pulse 64   Ht 5\' 3"  (1.6 m)   Wt 141 lb (64 kg)   BMI 24.98 kg/m   Physical Exam:  General: Alert and oriented. and No acute distress. Gait: Normal gait.  Evaluation of the right knee demonstrates a mild effusion.  Range of motion from 0-120 degrees without pain.  Mild discomfort with hyperflexion.  Pain in the medial aspect of the knee with McMurray's testing.  Negative Lachman.  No increased laxity varus or valgus stress.   She does have tenderness palpation along the medial joint line.  Toes are warm and well-perfused.   IMAGING: I personally ordered and reviewed the following images  X-rays of the right knee were obtained in clinic today.  No evidence of an acute injury.  Relatively neutral overall alignment.  Minimal osteophytes noted.  Well-maintained joint space within the medial and lateral compartments.  Mild lateral patellar tracking, without significant osteophytes.  Impression: Right knee x-rays without obvious degenerative changes or acute injuries   New Medications:  No orders of the defined types were placed in this encounter.     Mordecai Rasmussen, MD  08/27/2021 9:11 AM

## 2021-08-27 ENCOUNTER — Encounter: Payer: Self-pay | Admitting: Orthopedic Surgery

## 2022-03-02 DIAGNOSIS — C44311 Basal cell carcinoma of skin of nose: Secondary | ICD-10-CM | POA: Diagnosis not present

## 2022-04-01 DIAGNOSIS — Z08 Encounter for follow-up examination after completed treatment for malignant neoplasm: Secondary | ICD-10-CM | POA: Diagnosis not present

## 2022-04-01 DIAGNOSIS — Z85828 Personal history of other malignant neoplasm of skin: Secondary | ICD-10-CM | POA: Diagnosis not present

## 2022-04-29 ENCOUNTER — Other Ambulatory Visit: Payer: Self-pay | Admitting: Family Medicine

## 2022-04-29 DIAGNOSIS — Z1231 Encounter for screening mammogram for malignant neoplasm of breast: Secondary | ICD-10-CM

## 2022-05-24 ENCOUNTER — Ambulatory Visit
Admission: RE | Admit: 2022-05-24 | Discharge: 2022-05-24 | Disposition: A | Discharge status: Home / Self Care | Payer: Medicare HMO | Source: Ambulatory Visit | Attending: Family Medicine | Admitting: Family Medicine

## 2022-05-24 DIAGNOSIS — Z1231 Encounter for screening mammogram for malignant neoplasm of breast: Secondary | ICD-10-CM | POA: Diagnosis not present

## 2022-05-25 DIAGNOSIS — R69 Illness, unspecified: Secondary | ICD-10-CM | POA: Diagnosis not present

## 2022-05-25 DIAGNOSIS — G250 Essential tremor: Secondary | ICD-10-CM | POA: Diagnosis not present

## 2022-05-25 DIAGNOSIS — E559 Vitamin D deficiency, unspecified: Secondary | ICD-10-CM | POA: Diagnosis not present

## 2022-05-25 DIAGNOSIS — Z0000 Encounter for general adult medical examination without abnormal findings: Secondary | ICD-10-CM | POA: Diagnosis not present

## 2022-05-25 DIAGNOSIS — M8580 Other specified disorders of bone density and structure, unspecified site: Secondary | ICD-10-CM | POA: Diagnosis not present

## 2022-05-25 DIAGNOSIS — G43009 Migraine without aura, not intractable, without status migrainosus: Secondary | ICD-10-CM | POA: Diagnosis not present

## 2022-05-25 DIAGNOSIS — Z79899 Other long term (current) drug therapy: Secondary | ICD-10-CM | POA: Diagnosis not present

## 2022-05-25 DIAGNOSIS — E7800 Pure hypercholesterolemia, unspecified: Secondary | ICD-10-CM | POA: Diagnosis not present

## 2022-05-25 DIAGNOSIS — Z23 Encounter for immunization: Secondary | ICD-10-CM | POA: Diagnosis not present

## 2022-05-25 DIAGNOSIS — Z1211 Encounter for screening for malignant neoplasm of colon: Secondary | ICD-10-CM | POA: Diagnosis not present

## 2022-06-02 DIAGNOSIS — Z1211 Encounter for screening for malignant neoplasm of colon: Secondary | ICD-10-CM | POA: Diagnosis not present

## 2022-06-02 DIAGNOSIS — Z0000 Encounter for general adult medical examination without abnormal findings: Secondary | ICD-10-CM | POA: Diagnosis not present

## 2022-06-08 ENCOUNTER — Other Ambulatory Visit: Payer: Self-pay | Admitting: Family Medicine

## 2022-06-08 DIAGNOSIS — E785 Hyperlipidemia, unspecified: Secondary | ICD-10-CM

## 2022-07-27 DIAGNOSIS — Z0100 Encounter for examination of eyes and vision without abnormal findings: Secondary | ICD-10-CM | POA: Diagnosis not present

## 2022-08-05 DIAGNOSIS — L570 Actinic keratosis: Secondary | ICD-10-CM | POA: Diagnosis not present

## 2022-08-05 DIAGNOSIS — X32XXXA Exposure to sunlight, initial encounter: Secondary | ICD-10-CM | POA: Diagnosis not present

## 2022-10-19 ENCOUNTER — Other Ambulatory Visit: Payer: Medicare HMO

## 2022-12-24 DIAGNOSIS — R69 Illness, unspecified: Secondary | ICD-10-CM | POA: Diagnosis not present

## 2023-01-13 ENCOUNTER — Encounter: Payer: Self-pay | Admitting: Radiology

## 2023-01-14 DIAGNOSIS — R69 Illness, unspecified: Secondary | ICD-10-CM | POA: Diagnosis not present

## 2023-03-17 DIAGNOSIS — R69 Illness, unspecified: Secondary | ICD-10-CM | POA: Diagnosis not present

## 2023-03-24 DIAGNOSIS — R69 Illness, unspecified: Secondary | ICD-10-CM | POA: Diagnosis not present

## 2023-03-28 DIAGNOSIS — R69 Illness, unspecified: Secondary | ICD-10-CM | POA: Diagnosis not present

## 2023-04-07 DIAGNOSIS — R69 Illness, unspecified: Secondary | ICD-10-CM | POA: Diagnosis not present

## 2023-04-11 DIAGNOSIS — R69 Illness, unspecified: Secondary | ICD-10-CM | POA: Diagnosis not present

## 2023-04-14 DIAGNOSIS — R69 Illness, unspecified: Secondary | ICD-10-CM | POA: Diagnosis not present

## 2023-04-21 DIAGNOSIS — R69 Illness, unspecified: Secondary | ICD-10-CM | POA: Diagnosis not present

## 2023-04-25 DIAGNOSIS — R69 Illness, unspecified: Secondary | ICD-10-CM | POA: Diagnosis not present

## 2023-04-28 DIAGNOSIS — R69 Illness, unspecified: Secondary | ICD-10-CM | POA: Diagnosis not present

## 2023-05-10 ENCOUNTER — Other Ambulatory Visit: Payer: Self-pay | Admitting: Family Medicine

## 2023-05-10 DIAGNOSIS — Z1231 Encounter for screening mammogram for malignant neoplasm of breast: Secondary | ICD-10-CM

## 2023-05-12 DIAGNOSIS — R69 Illness, unspecified: Secondary | ICD-10-CM | POA: Diagnosis not present

## 2023-05-16 DIAGNOSIS — R69 Illness, unspecified: Secondary | ICD-10-CM | POA: Diagnosis not present

## 2023-05-23 DIAGNOSIS — R69 Illness, unspecified: Secondary | ICD-10-CM | POA: Diagnosis not present

## 2023-06-01 ENCOUNTER — Ambulatory Visit
Admission: RE | Admit: 2023-06-01 | Discharge: 2023-06-01 | Disposition: A | Discharge status: Home / Self Care | Payer: Medicare HMO | Source: Ambulatory Visit | Attending: Family Medicine | Admitting: Family Medicine

## 2023-06-01 DIAGNOSIS — Z1231 Encounter for screening mammogram for malignant neoplasm of breast: Secondary | ICD-10-CM

## 2023-06-02 DIAGNOSIS — R69 Illness, unspecified: Secondary | ICD-10-CM | POA: Diagnosis not present

## 2023-09-06 DIAGNOSIS — E7800 Pure hypercholesterolemia, unspecified: Secondary | ICD-10-CM | POA: Diagnosis not present

## 2023-09-06 DIAGNOSIS — Z79899 Other long term (current) drug therapy: Secondary | ICD-10-CM | POA: Diagnosis not present

## 2023-09-06 DIAGNOSIS — E559 Vitamin D deficiency, unspecified: Secondary | ICD-10-CM | POA: Diagnosis not present

## 2023-09-07 ENCOUNTER — Other Ambulatory Visit: Payer: Self-pay | Admitting: Family Medicine

## 2023-09-07 DIAGNOSIS — M8580 Other specified disorders of bone density and structure, unspecified site: Secondary | ICD-10-CM

## 2024-05-03 DIAGNOSIS — W0110XA Fall on same level from slipping, tripping and stumbling with subsequent striking against unspecified object, initial encounter: Secondary | ICD-10-CM | POA: Diagnosis not present

## 2024-05-03 DIAGNOSIS — R42 Dizziness and giddiness: Secondary | ICD-10-CM | POA: Diagnosis not present

## 2024-05-03 DIAGNOSIS — R531 Weakness: Secondary | ICD-10-CM | POA: Diagnosis not present

## 2024-05-03 DIAGNOSIS — S0990XA Unspecified injury of head, initial encounter: Secondary | ICD-10-CM | POA: Diagnosis not present

## 2024-07-12 ENCOUNTER — Other Ambulatory Visit: Payer: Self-pay | Admitting: Family Medicine

## 2024-07-12 DIAGNOSIS — Z1231 Encounter for screening mammogram for malignant neoplasm of breast: Secondary | ICD-10-CM

## 2024-08-06 ENCOUNTER — Ambulatory Visit
Admission: RE | Admit: 2024-08-06 | Discharge: 2024-08-06 | Disposition: A | Discharge status: Home / Self Care | Source: Ambulatory Visit | Attending: Family Medicine | Admitting: Family Medicine

## 2024-08-06 DIAGNOSIS — Z1231 Encounter for screening mammogram for malignant neoplasm of breast: Secondary | ICD-10-CM

## 2024-08-15 DIAGNOSIS — E7800 Pure hypercholesterolemia, unspecified: Secondary | ICD-10-CM | POA: Diagnosis not present

## 2024-08-15 DIAGNOSIS — Z79899 Other long term (current) drug therapy: Secondary | ICD-10-CM | POA: Diagnosis not present

## 2024-08-15 DIAGNOSIS — E559 Vitamin D deficiency, unspecified: Secondary | ICD-10-CM | POA: Diagnosis not present

## 2024-08-21 ENCOUNTER — Encounter: Payer: Self-pay | Admitting: Rehabilitative and Restorative Service Providers"

## 2024-08-21 ENCOUNTER — Ambulatory Visit: Attending: Family Medicine | Admitting: Rehabilitative and Restorative Service Providers"

## 2024-08-21 ENCOUNTER — Other Ambulatory Visit: Payer: Self-pay

## 2024-08-21 DIAGNOSIS — R42 Dizziness and giddiness: Secondary | ICD-10-CM | POA: Insufficient documentation

## 2024-08-21 NOTE — Therapy (Unsigned)
 OUTPATIENT PHYSICAL THERAPY VESTIBULAR EVALUATION     Patient Name: Karen Hubbard MRN: [id number] DOB:08-May-1946, 78 y.o., female Today's Date: 08/22/2024  END OF SESSION:  PT End of Session - 08/22/24 1140     Visit Number 1    Number of Visits 4    Date for Recertification  09/21/24    Authorization Type aetna medicare    PT Start Time 1450    PT Stop Time 1532    PT Time Calculation (min) 42 min    Activity Tolerance Patient tolerated treatment well    Behavior During Therapy WFL for tasks assessed/performed          Past Medical History:  Diagnosis Date   Depression    Diverticulitis    IBS (irritable bowel syndrome)    Migraine    Tremor    Vitamin D deficiency    Past Surgical History:  Procedure Laterality Date   ABDOMINAL HYSTERECTOMY     colectomy     There are no active problems to display for this patient.   PCP: Lamarr Rotunda, MD REFERRING PROVIDER: Lamarr Rotunda, MD  REFERRING DIAG: R42 (ICD-10-CM) - Dizziness   THERAPY DIAG:  No diagnosis found.  ONSET DATE: 10/125  Rationale for Evaluation and Treatment: Rehabilitation  SUBJECTIVE:   SUBJECTIVE STATEMENT: The patient reports that she had a sudden onset of a pulling sensation to the left side when walking in June. She fell and hit her head in her driveway. She began with immediate dizziness, no LOC. She had a CT scan that was WNLs.  She had new eye lenses that she received right before this episode. The dizziness was initially constant in nature x hours but could vary in intensity with movement.  She also notes harder to focus her vision with a sensation of blurriness. She did not have dizziness with rolling in bed. Since that time, she has had intermittent spells of dizziness where she can't stand up--these last approximately 10 minutes, she gets a HA with these spells (not at the level her migraine HA would be). She gets nausea and dry heaving with episodes It has been 2 weeks  since the last bad spell.  She has h/o migraines 2-3 days/month, she takes imitrex to treat.  She lately is noticing some visual aura wavy lines worse in the right eye.  Looking up quickly can provoke symptoms, but she notes avoiding this.  Pt accompanied by: self  PERTINENT HISTORY: essential tremors, IBS , migraine  PAIN:  Are you having pain? No  PRECAUTIONS: Fall  RED FLAGS: None   WEIGHT BEARING RESTRICTIONS: No  FALLS: Has patient fallen in last 6 months? Yes. Number of falls 1  LIVING ENVIRONMENT: Lives with: lives with their spouse Lives in: Other RV  PLOF: Independent  PATIENT GOALS: Get rid of dizzy spells.  OBJECTIVE:  Note: Objective measures were completed at Evaluation unless otherwise noted.  COGNITION: Overall cognitive status: Within functional limits for tasks assessed   Cervical ROM:  WFLs -- screened before testing head impulse and positional movements.  BED MOBILITY:  independent  TRANSFERS: Sit to stand: Complete Independence  GAIT: Comments: Patient ambulates indep without device into clinic  PATIENT SURVEYS:  N/a-- due to 2 visits planned-- giving HEP before patient leaves to move  VESTIBULAR ASSESSMENT:  GENERAL OBSERVATION: Wears progressive lenses   SYMPTOM BEHAVIOR:  Subjective history: Sudden onset lasitng hours  Non-Vestibular symptoms: changes in vision, headaches, nausea/vomiting, and migraine symptoms  Type of dizziness:  Blurred Vision, Imbalance (Disequilibrium), Unsteady with head/body turns, and Lightheadedness/Faint  Frequency: intermittent  Duration: initial onset x hours, now episodes last minutes  Aggravating factors: Spontaneous  Relieving factors: no known relieving factors  Progression of symptoms: better  OCULOMOTOR EXAM:  Ocular Alignment: R eye hypertropia (L head tilt with ocular ROM --could be bracing neck due to Essential tremors?  Ocular ROM: No Limitations  Spontaneous Nystagmus: absent  Gaze-Induced  Nystagmus: absent  Smooth Pursuits: intact  Saccades: intact, but provokes a bluriness out of the R eye   VESTIBULAR - OCULAR REFLEX:   Slow VOR: Comment: 3/10  when > 5 reps,hard to focus  VOR Cancellation: Normal  Head-Impulse Test: HIT Right: positive HIT Left: negative  Dynamic Visual Acuity: TBA   POSITIONAL TESTING: Right Dix-Hallpike: no nystagmus Left Dix-Hallpike: no nystagmus; mild dizziness with return to sitting Right Roll Test: no nystagmus, notes mild dizziness Left Roll Test: {no nystagmus  Right Sidelying: no nystagmus Left Sidelying: no nystagmus  MOTION SENSITIVITY:  Motion Sensitivity Quotient Intensity: 0 = none, 1 = Lightheaded, 2 = Mild, 3 = Moderate, 4 = Severe, 5 = Vomiting  Intensity  1. Sitting to supine   2. Supine to L side 0  3. Supine to R side 0  4. Supine to sitting   5. L Hallpike-Dix 0  6. Up from L  2/5  7. R Hallpike-Dix 0  8. Up from R    9. Sitting, head tipped to L knee   10. Head up from L knee   11. Sitting, head tipped to R knee   12. Head up from R knee   13. Sitting head turns x5 3-4/10 (2/5)  14.Sitting head nods x5 4/10 (2/5)  15. In stance, 180 turn to L    16. In stance, 180 turn to R    BALANCE: Eyes open solid surface x 30 seconds Eyes closed solid surface x 30 seconds Eyes open compliant surface x 30 seconds Eyes closed compliant surface x 30 seconds-- minimal sway (able to correct)   [facility] Adult PT Treatment:                                                DATE: 08/22/24                                                                                                                       Gaze Adaptation:  x1 Viewing Horizontal: Position: seated, Time: 30 seconds, and Comment: x 2 reps recommending increased speed as she can tolerate-- provided for HEP Habituation:  Seated Vertical Head Movements: number of reps: 5 and Seated Horizontal Head Movements: number of reps: 5 -- symptoms settle with rest -- discussed  provoking mild to moderate symptoms at home  PATIENT EDUCATION: Education details: HEP, goals of sessions, potential for referral to neurology to manage  HA if they continue Person educated: Patient Education method: Explanation, Demonstration, and Handouts Education comprehension: verbalized understanding and returned demonstration  HOME EXERCISE PROGRAM: Access Code: [email or web address] URL: [email or web address] Date: 08/21/2024 Prepared by: Tawni Ferrier  Exercises - Seated Gaze Stabilization with Head Rotation  - 2-3 x daily - 7 x weekly - 1 sets - 2 reps - 30 seconds hold - Seated habituation head turns  - 2 x daily - 5 x weekly - 1 sets - 5-10 reps - Seated Head Nods Vestibular Habituation  - 2 x daily - 5 x weekly - 1 sets - 5-10 reps  GOALS: Goals reviewed with patient? Yes  LONG TERM GOALS: Target date: 09/21/24  The patient will be indep with progression of HEP. Baseline:  initiated at eval Goal status: INITIAL  2.  The patient will tolerate gaze x 1 viewing horizontal plane x 60 seconds in seated position with increase in dizziness < 2/10. Baseline: 3/10 in 5 reps at eval Goal status: INITIAL  3.  The patient will tolerate standing habituation for ongoing HEP progression. Baseline:  provided in seated position at eval Goal status: INITIAL  ASSESSMENT:  CLINICAL IMPRESSION: Patient is a 78 y.o. female who was seen today for physical therapy evaluation and treatment for dizziness. She presents today with impairments in VOR, motion sensitivity, and balance during episodes of dizziness. The patient was negative for positional symptoms today. She also has a h/o migraines which may be contributing to current symptoms. PT and patient discussed potential need for neurology follow up if blurred vision, visual aura, and HA symptoms continue as the recent fall may have exacerbated her h/o migraines. PT to provide HEP and guidance for progression due to upcoming move (lives  in RV and is able to move around).    OBJECTIVE IMPAIRMENTS: decreased activity tolerance and dizziness.   ACTIVITY LIMITATIONS: locomotion level  PARTICIPATION LIMITATIONS: community activity  PERSONAL FACTORS: 1-2 comorbidities: migraines, essential tremor are also affecting patient's functional outcome.   REHAB POTENTIAL: Good  CLINICAL DECISION MAKING: Evolving/moderate complexity  EVALUATION COMPLEXITY: Moderate   PLAN:  PT FREQUENCY: 1x/week  PT DURATION: 4 weeks  PLANNED INTERVENTIONS: 97164- PT Re-evaluation, 97750- Physical Performance Testing, 97110-Therapeutic exercises, 97530- Therapeutic activity, [id number]- Neuromuscular re-education, 97535- Self Care, [id number]- Manual therapy, [id number]- Gait training, [id number]- Canalith repositioning, Patient/Family education, and Vestibular training  PLAN FOR NEXT SESSION: check HEP, progress- provide HEP.   Tayton Decaire, PT 08/22/2024, 11:41 AM

## 2024-08-28 ENCOUNTER — Encounter: Admitting: Rehabilitative and Restorative Service Providers"

## 2024-08-29 ENCOUNTER — Encounter: Payer: Self-pay | Admitting: Rehabilitative and Restorative Service Providers"

## 2024-08-29 ENCOUNTER — Ambulatory Visit: Admitting: Rehabilitative and Restorative Service Providers"

## 2024-08-29 DIAGNOSIS — R42 Dizziness and giddiness: Secondary | ICD-10-CM | POA: Diagnosis not present

## 2024-08-29 NOTE — Therapy (Addendum)
 OUTPATIENT PHYSICAL THERAPY VESTIBULAR TREATMENT AND DISCHARGE SUMMARY  Patient Name: Karen Hubbard MRN: [id number] DOB:June 19, 1946, 78 y.o., female Today's Date: 08/29/2024  PHYSICAL THERAPY DISCHARGE SUMMARY  Visits from Start of Care: 2  Current functional level related to goals / functional outcomes: See below   Remaining deficits: Mild dizziness with head turns   Education / Equipment: HEP   Patient agrees to discharge. Patient goals were met. Patient is being discharged due to meeting the stated rehab goals.  END OF SESSION:  PT End of Session - 08/29/24 1310     Visit Number 2    Number of Visits 4    Date for Recertification  09/21/24    Authorization Type aetna medicare    PT Start Time 1315    PT Stop Time 1354    PT Time Calculation (min) 39 min    Activity Tolerance Patient tolerated treatment well    Behavior During Therapy WFL for tasks assessed/performed          Past Medical History:  Diagnosis Date   Depression    Diverticulitis    IBS (irritable bowel syndrome)    Migraine    Tremor    Vitamin D deficiency    Past Surgical History:  Procedure Laterality Date   ABDOMINAL HYSTERECTOMY     colectomy     There are no active problems to display for this patient.   PCP: Lamarr Rotunda, MD REFERRING PROVIDER: Lamarr Rotunda, MD  REFERRING DIAG: R42 (ICD-10-CM) - Dizziness   THERAPY DIAG:  Dizziness and giddiness  ONSET DATE: 10/125  Rationale for Evaluation and Treatment: Rehabilitation  SUBJECTIVE:   SUBJECTIVE STATEMENT: The patient reports exercises are going well at home-- no further episodes of dizziness. She is able to return to driving. She still notes mild symptoms when looking up.   EVAL: The patient reports that she had a sudden onset of a pulling sensation to the left side when walking in June. She fell and hit her head in her driveway. She began with immediate dizziness, no LOC. She had a CT scan that was WNLs.   She had new eye lenses that she received right before this episode. The dizziness was initially constant in nature x hours but could vary in intensity with movement.  She also notes harder to focus her vision with a sensation of blurriness. She did not have dizziness with rolling in bed. Since that time, she has had intermittent spells of dizziness where she can't stand up--these last approximately 10 minutes, she gets a HA with these spells (not at the level her migraine HA would be). She gets nausea and dry heaving with episodes It has been 2 weeks since the last bad spell.  She has h/o migraines 2-3 days/month, she takes imitrex to treat.  She lately is noticing some visual aura wavy lines worse in the right eye.  Looking up quickly can provoke symptoms, but she notes avoiding this.  Pt accompanied by: self  PERTINENT HISTORY: essential tremors, IBS , migraine  PAIN:  Are you having pain? No  PRECAUTIONS: Fall  RED FLAGS: None   WEIGHT BEARING RESTRICTIONS: No  FALLS: Has patient fallen in last 6 months? Yes. Number of falls 1  LIVING ENVIRONMENT: Lives with: lives with their spouse Lives in: Other RV  PLOF: Independent  PATIENT GOALS: Get rid of dizzy spells.  OBJECTIVE:  Note: Objective measures were completed at Evaluation unless otherwise noted.  COGNITION: Overall cognitive status: Within functional limits  for tasks assessed   Cervical ROM:  WFLs -- screened before testing head impulse and positional movements.  BED MOBILITY:  independent  TRANSFERS: Sit to stand: Complete Independence  GAIT: Comments: Patient ambulates indep without device into clinic  PATIENT SURVEYS:  N/a-- due to 2 visits planned-- giving HEP before patient leaves to move  VESTIBULAR ASSESSMENT:  GENERAL OBSERVATION: Wears progressive lenses   SYMPTOM BEHAVIOR:  Subjective history: Sudden onset lasitng hours  Non-Vestibular symptoms: changes in vision, headaches, nausea/vomiting,  and migraine symptoms  Type of dizziness: Blurred Vision, Imbalance (Disequilibrium), Unsteady with head/body turns, and Lightheadedness/Faint  Frequency: intermittent  Duration: initial onset x hours, now episodes last minutes  Aggravating factors: Spontaneous  Relieving factors: no known relieving factors  Progression of symptoms: better  OCULOMOTOR EXAM:  Ocular Alignment: R eye hypertropia (L head tilt with ocular ROM --could be bracing neck due to Essential tremors?  Ocular ROM: No Limitations  Spontaneous Nystagmus: absent  Gaze-Induced Nystagmus: absent  Smooth Pursuits: intact  Saccades: intact, but provokes a bluriness out of the R eye   VESTIBULAR - OCULAR REFLEX:   Slow VOR: Comment: 3/10  when > 5 reps,hard to focus  VOR Cancellation: Normal  Head-Impulse Test: HIT Right: positive HIT Left: negative  Dynamic Visual Acuity: TBA   POSITIONAL TESTING: Right Dix-Hallpike: no nystagmus Left Dix-Hallpike: no nystagmus; mild dizziness with return to sitting Right Roll Test: no nystagmus, notes mild dizziness Left Roll Test: {no nystagmus  Right Sidelying: no nystagmus Left Sidelying: no nystagmus  MOTION SENSITIVITY:  Motion Sensitivity Quotient Intensity: 0 = none, 1 = Lightheaded, 2 = Mild, 3 = Moderate, 4 = Severe, 5 = Vomiting  Intensity  1. Sitting to supine   2. Supine to L side 0  3. Supine to R side 0  4. Supine to sitting   5. L Hallpike-Dix 0  6. Up from L  2/5  7. R Hallpike-Dix 0  8. Up from R    9. Sitting, head tipped to L knee   10. Head up from L knee   11. Sitting, head tipped to R knee   12. Head up from R knee   13. Sitting head turns x5 3-4/10 (2/5)  14.Sitting head nods x5 4/10 (2/5)  15. In stance, 180 turn to L    16. In stance, 180 turn to R    BALANCE: Eyes open solid surface x 30 seconds Eyes closed solid surface x 30 seconds Eyes open compliant surface x 30 seconds Eyes closed compliant surface x 30 seconds-- minimal sway (able  to correct)  [facility] Adult PT Treatment:                                                DATE: 08/29/24 Neuromuscular re-ed: Gaze adaptation Seated x 30 seconds Standing x 30 seconds Standing x 60 seconds Standing vertical x 30 seconds Habituation Head motion vertical x 10 reps with mild dizziness Head motion horizontal  x 10 reps 1/2 turns x 10 reps Full turns-- for progression Figure 8 turns -- for progression    [facility] Adult PT Treatment:  DATE: 08/22/24                                                                                                                       Gaze Adaptation:  x1 Viewing Horizontal: Position: seated, Time: 30 seconds, and Comment: x 2 reps recommending increased speed as she can tolerate-- provided for HEP Habituation:  Seated Vertical Head Movements: number of reps: 5 and Seated Horizontal Head Movements: number of reps: 5 -- symptoms settle with rest -- discussed provoking mild to moderate symptoms at home  PATIENT EDUCATION: Education details: HEP, goals of sessions, potential for referral to neurology to manage HA if they continue Person educated: Patient Education method: Explanation, Demonstration, and Handouts Education comprehension: verbalized understanding and returned demonstration  HOME EXERCISE PROGRAM: Access Code: [email or web address] URL: [email or web address] Date: 08/29/2024 Prepared by: Tawni Ferrier  Program Notes Search You  tube for optokinetic videos ([facility] or [facility])  Exercises - Standing Gaze Stabilization with Head Rotation  - 2 x daily - 5 x weekly - 1 sets - 1 reps - 60 seconds hold - Standing Gaze Stabilization with Head Nod  - 2 x daily - 5 x weekly - 1 sets - 1 reps - 30-60 seconds hold - Romberg Stance with Head Nods  - 1 x daily - 3 x weekly - 1 sets - 10 reps - Standing Single Leg Stance with Counter Support  - 1 x daily - 3 x  weekly - 1 sets - 3 reps - 10 seconds hold - Standing Half Turn with Counter Support  - 1 x daily - 3 x weekly - 1 sets - 5 reps - Turning in Corner 360  - 1 x daily - 3 x weekly - 1 sets - 5 reps - Figure-8 Walking Around [place]  - 1 x daily - 3 x weekly - 1 sets - 5 reps  GOALS: Goals reviewed with patient? Yes  LONG TERM GOALS: Target date: 09/21/24  The patient will be indep with progression of HEP. Baseline:  initiated at eval Goal status: MET  2.  The patient will tolerate gaze x 1 viewing horizontal plane x 60 seconds in seated position with increase in dizziness < 2/10. Baseline: 3/10 in 5 reps at eval Goal status: MET  3.  The patient will tolerate standing habituation for ongoing HEP progression. Baseline:  provided in seated position at eval Goal status: MET  ASSESSMENT:  CLINICAL IMPRESSION: The patient met 3/3 LTGs. She has mild symptoms provoked during vertical head turns and during 180 degree turns. Patient is moving to [place]  for the next few months so we discussed progression of HEP and when to f/u with MD.   EVAL: Patient is a 78 y.o. female who was seen today for physical therapy evaluation and treatment for dizziness. She presents today with impairments in VOR, motion sensitivity, and balance during episodes of dizziness. The patient was negative for positional symptoms today. She also has a h/o migraines which  may be contributing to current symptoms. PT and patient discussed potential need for neurology follow up if blurred vision, visual aura, and HA symptoms continue as the recent fall may have exacerbated her h/o migraines. PT to provide HEP and guidance for progression due to upcoming move (lives in RV and is able to move around).    OBJECTIVE IMPAIRMENTS: decreased activity tolerance and dizziness.   PLAN:  PT FREQUENCY: 1x/week  PT DURATION: 4 weeks  PLANNED INTERVENTIONS: 97164- PT Re-evaluation, 97750- Physical Performance Testing, 97110-Therapeutic  exercises, 97530- Therapeutic activity, [id number]- Neuromuscular re-education, 97535- Self Care, 02859- Manual therapy, [phone]- Gait training, [phone]- Canalith repositioning, Patient/Family education, and Vestibular training  PLAN FOR NEXT SESSION: discharge   Rennae Ferraiolo, PT 08/29/2024, 1:57 PM

## 2025-02-19 ENCOUNTER — Institutional Professional Consult (permissible substitution): Admitting: Neurology
